# Patient Record
Sex: Female | Born: 1935 | Race: White | Hispanic: No | Smoking: Never smoker
Health system: Southern US, Community
[De-identification: ages and names within clinical notes are randomized; demographics above are authoritative.]

## PROBLEM LIST (undated history)

## (undated) DIAGNOSIS — F32A Depression, unspecified: Secondary | ICD-10-CM

## (undated) DIAGNOSIS — F329 Major depressive disorder, single episode, unspecified: Secondary | ICD-10-CM

## (undated) DIAGNOSIS — F419 Anxiety disorder, unspecified: Secondary | ICD-10-CM

## (undated) DIAGNOSIS — I10 Essential (primary) hypertension: Secondary | ICD-10-CM

## (undated) DIAGNOSIS — I509 Heart failure, unspecified: Secondary | ICD-10-CM

## (undated) DIAGNOSIS — E785 Hyperlipidemia, unspecified: Secondary | ICD-10-CM

## (undated) DIAGNOSIS — R569 Unspecified convulsions: Secondary | ICD-10-CM

## (undated) HISTORY — PX: EYE SURGERY: SHX253

## (undated) HISTORY — DX: Hyperlipidemia, unspecified: E78.5

## (undated) HISTORY — PX: APPENDECTOMY: SHX54

## (undated) HISTORY — DX: Anxiety disorder, unspecified: F41.9

## (undated) HISTORY — DX: Depression, unspecified: F32.A

## (undated) HISTORY — PX: CORONARY ARTERY BYPASS GRAFT: SHX141

## (undated) HISTORY — DX: Heart failure, unspecified: I50.9

## (undated) HISTORY — DX: Essential (primary) hypertension: I10

## (undated) HISTORY — DX: Major depressive disorder, single episode, unspecified: F32.9

## (undated) HISTORY — DX: Unspecified convulsions: R56.9

---

## 2007-06-12 ENCOUNTER — Encounter: Payer: Self-pay | Admitting: Emergency Medicine

## 2007-06-12 ENCOUNTER — Inpatient Hospital Stay (HOSPITAL_COMMUNITY): Admission: EM | Admit: 2007-06-12 | Discharge: 2007-06-16 | Payer: Self-pay | Admitting: Cardiology

## 2007-06-19 ENCOUNTER — Inpatient Hospital Stay (HOSPITAL_COMMUNITY): Admission: EM | Admit: 2007-06-19 | Discharge: 2007-06-29 | Payer: Self-pay | Admitting: Emergency Medicine

## 2007-06-20 ENCOUNTER — Encounter (INDEPENDENT_AMBULATORY_CARE_PROVIDER_SITE_OTHER): Payer: Self-pay | Admitting: Emergency Medicine

## 2007-06-20 ENCOUNTER — Ambulatory Visit: Payer: Self-pay | Admitting: Vascular Surgery

## 2007-07-05 ENCOUNTER — Inpatient Hospital Stay (HOSPITAL_COMMUNITY): Admission: EM | Admit: 2007-07-05 | Discharge: 2007-07-12 | Payer: Self-pay | Admitting: Emergency Medicine

## 2009-05-13 ENCOUNTER — Encounter (INDEPENDENT_AMBULATORY_CARE_PROVIDER_SITE_OTHER): Payer: Self-pay | Admitting: Internal Medicine

## 2009-05-13 ENCOUNTER — Ambulatory Visit: Payer: Self-pay | Admitting: Vascular Surgery

## 2010-03-05 ENCOUNTER — Inpatient Hospital Stay (HOSPITAL_COMMUNITY): Admission: EM | Admit: 2010-03-05 | Discharge: 2009-05-15 | Payer: Self-pay | Admitting: Emergency Medicine

## 2010-05-20 IMAGING — CT CT HEAD W/O CM
1 of 2 series · 14 of 30 positions shown, 18 images · non-contrast
Comparison: None.

CLINICAL DATA: Altered mental status.  Altered level of
consciousness.

CT HEAD WITHOUT CONTRAST
TECHNIQUE: Contiguous axial images were obtained from the base of
the skull through the vertex without contrast.

[Series 2: brain · axial · 0.47mm/px · z∈[+79,+204]mm · 14 of 40 slices shown, 18 images]
[im 3/40  brain]
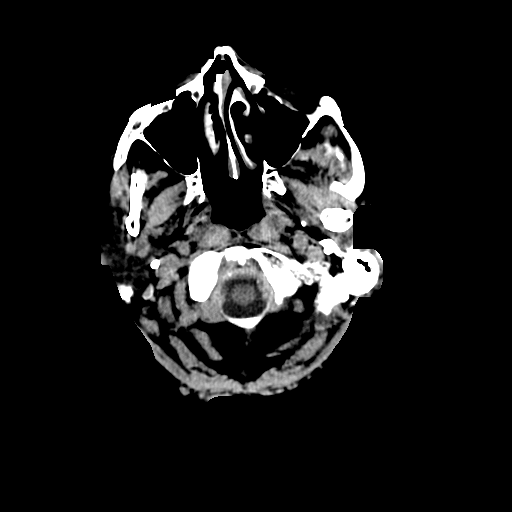
[im 3/40  bone]
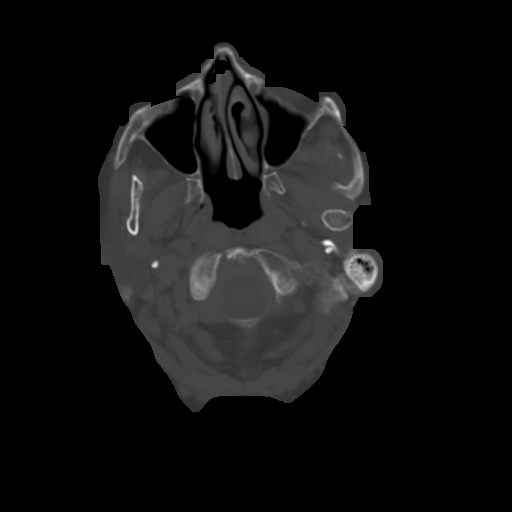
[im 6/40  brain]
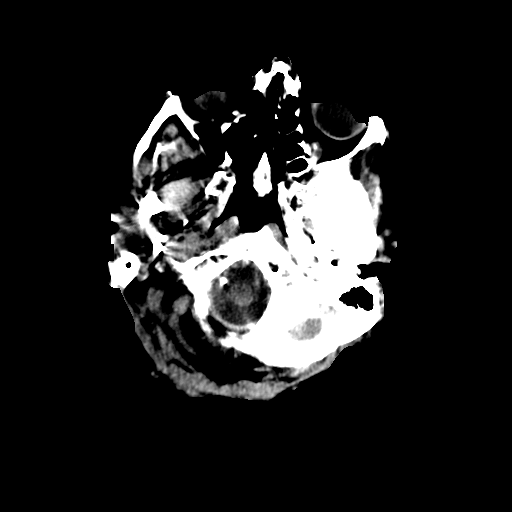
[im 8/40  brain]
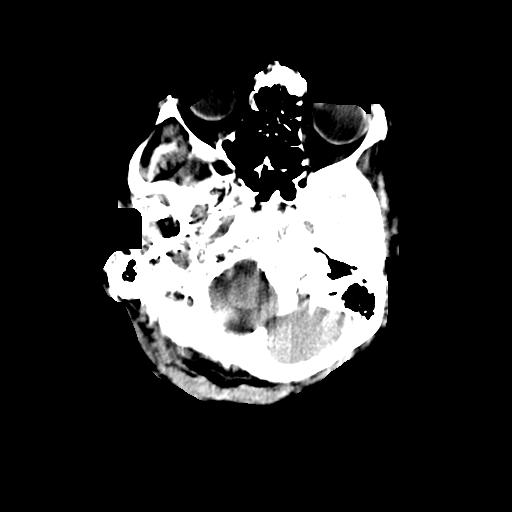
[im 11/40  brain]
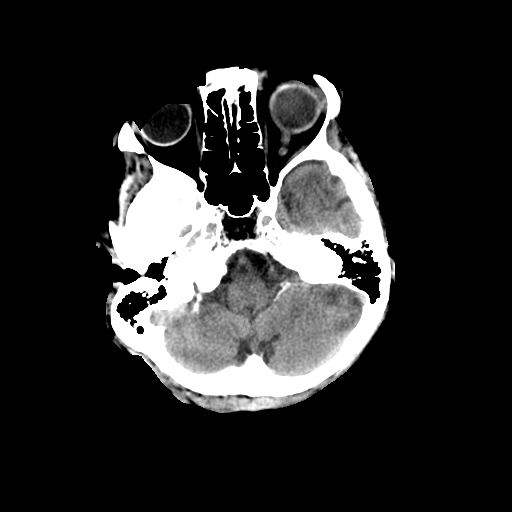
[im 14/40  brain]
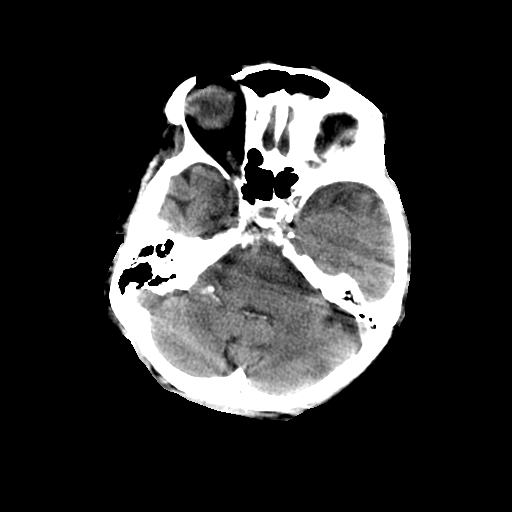
[im 14/40  bone]
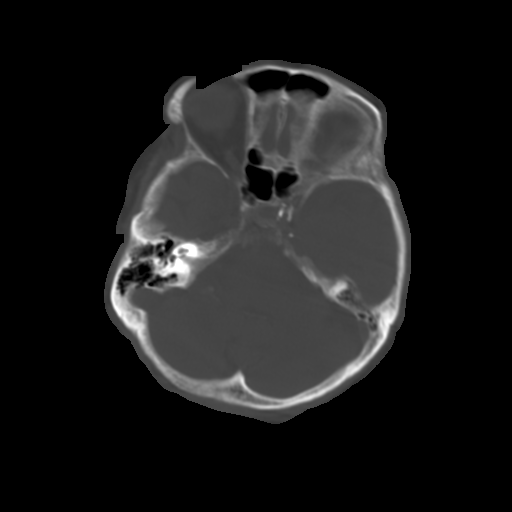
[im 16/40  brain]
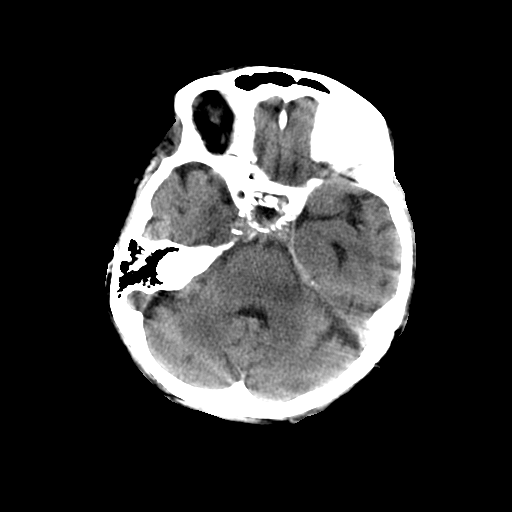
[im 19/40  brain]
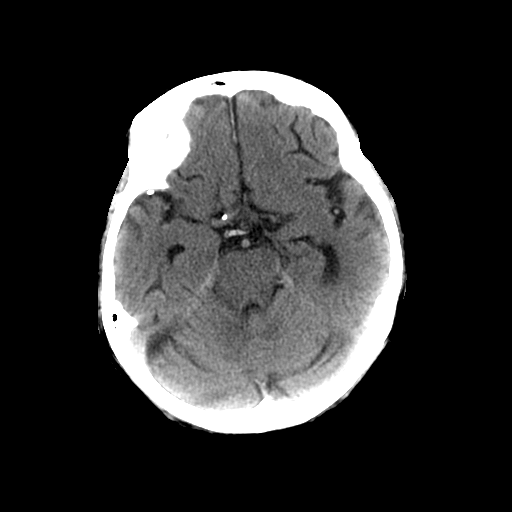
[im 21/40  brain]
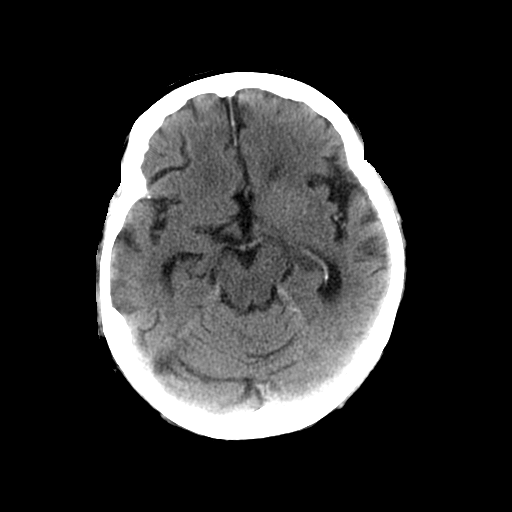
[im 24/40  brain]
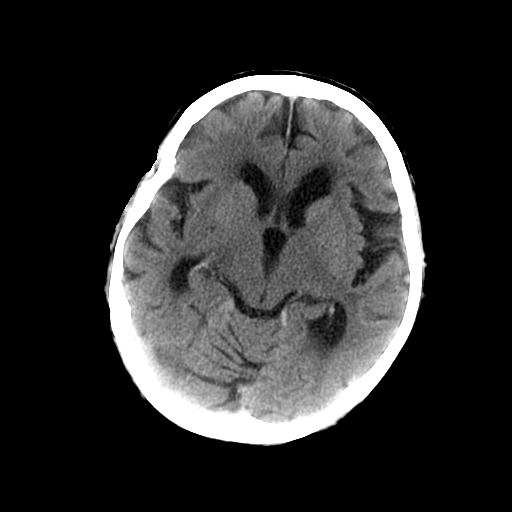
[im 24/40  bone]
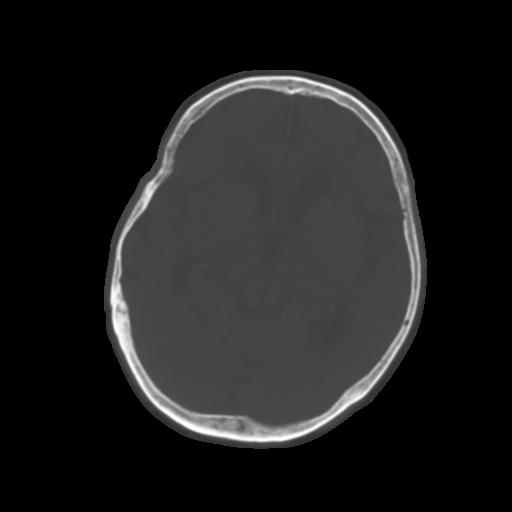
[im 27/40  brain]
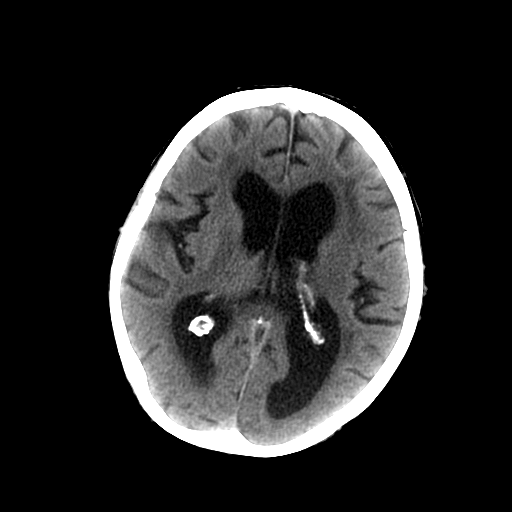
[im 29/40  brain]
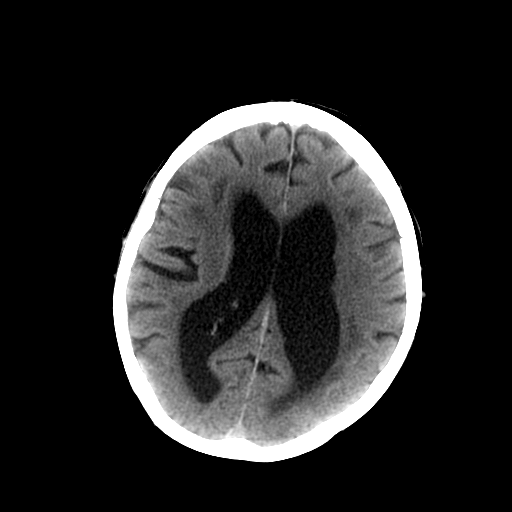
[im 32/40  brain]
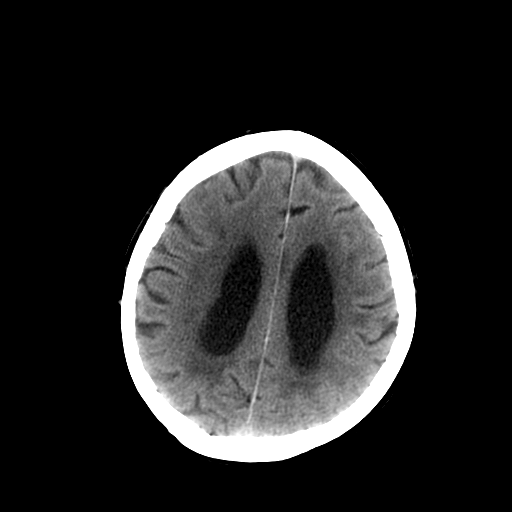
[im 34/40  brain]
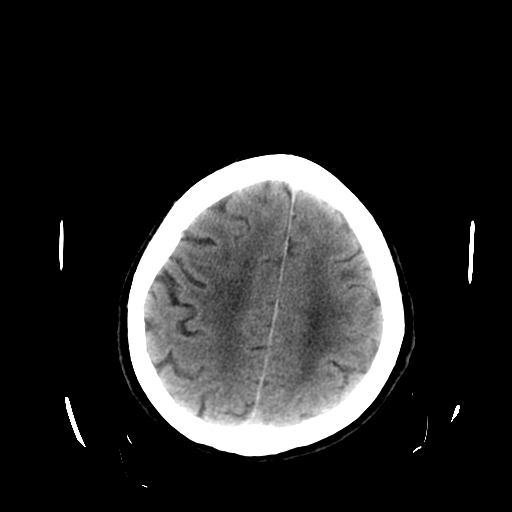
[im 34/40  bone]
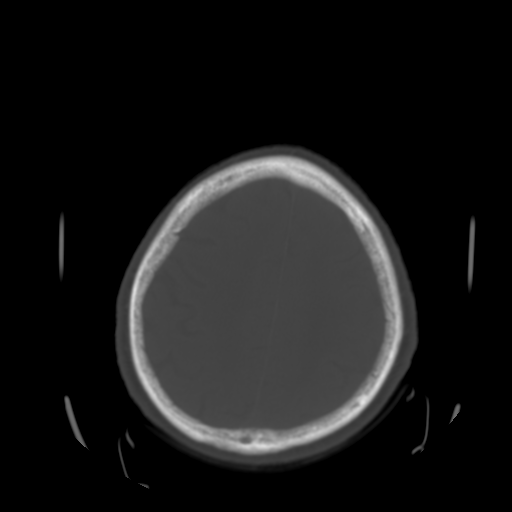
[im 37/40  brain]
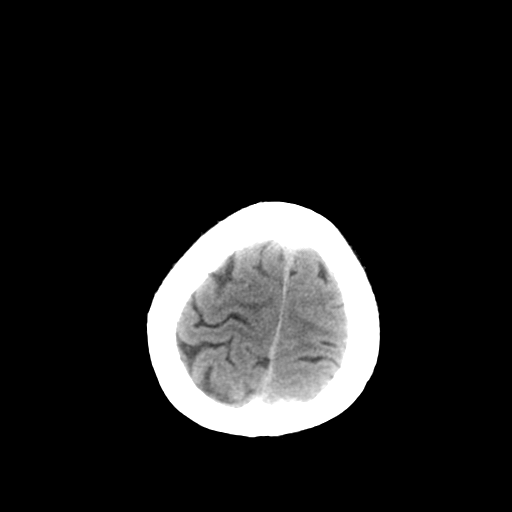

[14 of 30 positions shown; findings below may reference images not displayed]

FINDINGS: Motion artifact was present on the initial examination.
Slices were repeated.  Intracranial atherosclerosis.  High
attenuation of the intravascular compartment suggesting
dehydration.  Chronic ischemic white matter disease and atrophy.
Tiny lacunar infarct in the right basal ganglia.  Paranasal sinuses
appear within normal limits.  Debris in the left external auditory
canal.
IMPRESSION: 1.  No acute intracranial abnormality.
2.  Atrophy, chronic ischemic white matter disease and right basal
ganglia lacunar infarct.

## 2010-06-17 LAB — DIFFERENTIAL
Basophils Absolute: 0 10*3/uL (ref 0.0–0.1)
Lymphocytes Relative: 22 % (ref 12–46)
Lymphocytes Relative: 34 % (ref 12–46)
Lymphs Abs: 2.7 10*3/uL (ref 0.7–4.0)
Monocytes Absolute: 0.3 10*3/uL (ref 0.1–1.0)
Monocytes Absolute: 1.3 10*3/uL — ABNORMAL HIGH (ref 0.1–1.0)
Monocytes Relative: 5 % (ref 3–12)
Neutro Abs: 3.7 10*3/uL (ref 1.7–7.7)
Neutro Abs: 4.5 10*3/uL (ref 1.7–7.7)
Neutrophils Relative %: 73 % (ref 43–77)

## 2010-06-17 LAB — CARDIAC PANEL(CRET KIN+CKTOT+MB+TROPI)
CK, MB: 1.1 ng/mL (ref 0.3–4.0)
CK, MB: 1.3 ng/mL (ref 0.3–4.0)
Relative Index: INVALID (ref 0.0–2.5)
Relative Index: INVALID (ref 0.0–2.5)
Total CK: 32 U/L (ref 7–177)
Total CK: 43 U/L (ref 7–177)
Total CK: 64 U/L (ref 7–177)
Troponin I: 0.02 ng/mL (ref 0.00–0.06)

## 2010-06-17 LAB — BASIC METABOLIC PANEL
CO2: 27 mEq/L (ref 19–32)
Calcium: 8.9 mg/dL (ref 8.4–10.5)
Chloride: 104 mEq/L (ref 96–112)
GFR calc Af Amer: >60 mL/min (ref >60)
GFR calc Af Amer: >60 mL/min (ref >60)
GFR calc non Af Amer: >60 mL/min (ref >60)
Glucose, Bld: 100 mg/dL — ABNORMAL HIGH (ref 70–99)
Potassium: 3.5 mEq/L (ref 3.5–5.1)
Sodium: 139 mEq/L (ref 135–145)
Sodium: 143 mEq/L (ref 135–145)

## 2010-06-17 LAB — URINALYSIS, ROUTINE W REFLEX MICROSCOPIC
Bilirubin Urine: NEGATIVE
Glucose, UA: NEGATIVE mg/dL
Ketones, ur: 15 mg/dL — AB
Nitrite: POSITIVE — AB
Protein, ur: 30 mg/dL — AB
Specific Gravity, Urine: 1.018 (ref 1.005–1.030)
Urobilinogen, UA: 1 mg/dL (ref 0.0–1.0)
pH: 7 (ref 5.0–8.0)

## 2010-06-17 LAB — URINE CULTURE: Colony Count: >=100000

## 2010-06-17 LAB — CBC
HCT: 35.7 % — ABNORMAL LOW (ref 36.0–46.0)
Hemoglobin: 12.5 g/dL (ref 12.0–15.0)
MCV: 83.6 fL (ref 78.0–100.0)
RBC: 4.3 MIL/uL (ref 3.87–5.11)
RDW: 13.1 % (ref 11.5–15.5)
RDW: 13.6 % (ref 11.5–15.5)
WBC: 6.2 10*3/uL (ref 4.0–10.5)
WBC: 7.8 10*3/uL (ref 4.0–10.5)

## 2010-06-17 LAB — PHOSPHORUS: Phosphorus: 3.3 mg/dL (ref 2.3–4.6)

## 2010-06-17 LAB — POCT CARDIAC MARKERS
CKMB, poc: <1 ng/mL — ABNORMAL LOW (ref 1.0–8.0)
Myoglobin, poc: 53.5 ng/mL (ref 12–200)
Troponin i, poc: <0.05 ng/mL (ref 0.00–0.09)

## 2010-06-17 LAB — POCT I-STAT, CHEM 8
BUN: 21 mg/dL (ref 6–23)
Calcium, Ion: 1.07 mmol/L — ABNORMAL LOW (ref 1.12–1.32)
Chloride: 108 meq/L (ref 96–112)
Creatinine, Ser: 0.6 mg/dL (ref 0.4–1.2)
Glucose, Bld: 117 mg/dL — ABNORMAL HIGH (ref 70–99)
HCT: 41 % (ref 36.0–46.0)
Hemoglobin: 13.9 g/dL (ref 12.0–15.0)
Potassium: 4 meq/L (ref 3.5–5.1)
Sodium: 138 meq/L (ref 135–145)
TCO2: 25 mmol/L (ref 0–100)

## 2010-06-17 LAB — LIPID PANEL
HDL: 39 mg/dL — ABNORMAL LOW
Total CHOL/HDL Ratio: 3.1 ratio
Triglycerides: 48 mg/dL
VLDL: 10 mg/dL (ref 0–40)

## 2010-06-17 LAB — HEMOGLOBIN A1C
Hgb A1c MFr Bld: 6 % (ref 4.6–6.1)
Mean Plasma Glucose: 126 mg/dL

## 2010-06-17 LAB — TROPONIN I: Troponin I: 0.02 ng/mL (ref 0.00–0.06)

## 2010-06-17 LAB — TSH
TSH: 1.702 u[IU]/mL (ref 0.350–4.500)
TSH: 1.819 u[IU]/mL (ref 0.350–4.500)

## 2010-06-17 LAB — CK TOTAL AND CKMB (NOT AT ARMC)
CK, MB: 1.2 ng/mL (ref 0.3–4.0)
Relative Index: INVALID (ref 0.0–2.5)
Total CK: 70 U/L (ref 7–177)

## 2010-06-17 LAB — RPR: RPR Ser Ql: NONREACTIVE

## 2010-06-17 LAB — URINE MICROSCOPIC-ADD ON

## 2010-08-11 NOTE — Cardiovascular Report (Signed)
Shaw, Courtney         ACCOUNT NO.:  [id number]   MEDICAL RECORD NO.:  [id number]          PATIENT TYPE:  INP   LOCATION:  6526                         FACILITY:  [facility]   PHYSICIAN:  Mohan N. Sharyn Lull, M.D. DATE OF BIRTH:  1919-12-22   DATE OF PROCEDURE:  06/15/2007  DATE OF DISCHARGE:                            CARDIAC CATHETERIZATION   PROCEDURES:  1. Left cardiac cath with selective left and right coronary      angiography, visualization of saphenous vein graft, LIMA graft via      right groin using Judkins technique.  2. Successful PTCA to PDA using 2.0 x 12 mm long Voyager balloon.  3. Successful PTCA to mid and proximal RCA using 2.0 x 12 mm long      Voyager balloon.  4. Successful PTCA to proximal RCA using 2.5 x 12 mm long Voyager      balloon.  5. Successful deployment of 2.75 x 28 mm long Promus drug-eluting      stent in mid RCA.  6. Successful deployment of 3.0 x 33 mm long Cypher drug-eluting stent      in proximal RCA.  7. Successful post dilatation of both of these stents using 3.0 x 20      mm long [place] Voyager balloon.   INDICATION FOR PROCEDURE:  Courtney Shaw is an 77 year old  white female with past medical history significant for coronary artery  disease, status post CABG and AVR.  She had a bioprosthetic aortic valve  approximately 2-1/2 years ago, history of MI, congestive heart failure,  degenerative joint disease.  She came to the ER via EMS complaining of  vague right-sided chest pain off and on for last 1 week associated with  numbness in the hands.  Denies any nausea, vomiting, diaphoresis.  Denies PND, orthopnea, leg swelling.  Denies palpitation,  lightheadedness, syncope.  The patient was admitted to the telemetry  unit.  MI was ruled out by serial enzymes and EKG.  The patient  subsequently underwent Persantine Myoview, which showed reversible  ischemia in the inferior wall with EF of 63%.  Due to typical anginal  chest pain,  abnormal Persantine Myoview, discussed with the patient and  her daughter regarding left cath, possible PTCA and stenting, its risks  and benefits i.e. death, MI, stroke, need for emergency CABG, risk of  restenosis, local vascular complications, etc., and consented for the  procedure.   PROCEDURE:  After obtaining the informed consent, the patient was  brought to the cath lab and was placed on fluoroscopy table.  Right  groin was prepped and draped in the usual fashion, and 2% Xylocaine was  used for local anesthesia in the right groin.  With the help of a thin-  wall needle, a 6-French arterial sheath was placed.  The sheath was  aspirated and flushed.  Next, a 6-French left Judkins catheter was  advanced over the wire under fluoroscopic guidance up to the ascending  aorta.  Wire was pulled out, the catheter was aspirated and connected to  the manifold.  Catheter was further advanced and engaged into left  coronary ostium.  Multiple views  of the left system were taken.  Next,  the catheter was disengaged and was pulled out over the wire and was  replaced with 6-French right Judkins catheter,  which was advanced over  the wire under fluoroscopic guidance up to the ascending aorta.  The  wire was pulled out.  The catheter was aspirated and connected to the  manifold.  Catheter was further advanced and engaged into right coronary  ostium.  Multiple views of the right system were taken.  Next, the  catheter was disengaged and was engaged into saphenous vein graft to  diagonal one.  Multiple views of this graft were taken.  Next, catheter  was disengaged and was engaged into LIMA to LAD.  This catheter actually  was exchanged over the wire to LIMA diagnostic catheter over the wire,  which was engaged into LIMA to LAD.  Multiple views of this graft were  taken.  Next, catheter was disengaged and was pulled out over the wire.  Sheaths were aspirated and flushed.   FINDINGS:  LV was not done.   Left main had 50% to 60% distal stenosis.  The LAD has 85% to 90% ostial stenosis and then 100% occluded beyond the  mid portion.  Ramus was patent.  Left circumflex was patent.  OM1 to OM4  were small, which were patent.  The RCA has 60% to 90% mid sequential  stenosis and 80% to 85% proximal stenosis.  The PDA has 70% to 75%  proximal stenosis.   INTERVENTIONAL PROCEDURE:  Successful PTCA to mid and proximal RCA was  done using 2.0 x 12 mm long Voyager balloon for predilatation, and then  PTCA to PDA was done using same 2.0 x 12 mm long Voyager balloon.  Lesion in  PDA was dilated from 70% to 75% to less than 10% residual  with excellent TIMI grade 3 distal flow without evidence of dissection  or distal embolization, and then 2.75 x 28 mm long Promus drug-eluting  stent was deployed in the mid RCA at 15 atmospheres pressure and then  3.0 x 33 mm long Cypher drug-eluting stent was deployed in proximal RCA,  overlapping with the proximal edge of Promus stent at 15 atmospheres  pressure, and both these stents were postdilated using 3.0 x 20 mm long  [place] Voyager balloon, going from 18 to 22 atmospheric pressure.  Lesion  was dilated from 60% to 90% to 0% residual with excellent TIMI grade III  distal flow without evidence of dissection or distal embolization.  The  patient received weight-based Angiomax, 300 mg of Plavix during the  procedure.  The patient had edematous rash and itching during the  procedure.  The patient received Solu-Medrol, Benadryl and Pepcid with  resolution of the itching and the rash.  The patient tolerated the  procedure well.  There were no complications.  The patient was  transferred to recovery room in stable condition.      Eduardo Osier. Sharyn Lull, M.D.  Electronically Signed     [facility]/MEDQ  D:  06/15/2007  T:  06/15/2007  Job:  [id number]

## 2010-08-11 NOTE — Discharge Summary (Signed)
Courtney Shaw, Courtney Shaw         ACCOUNT NO.:  [id number]   MEDICAL RECORD NO.:  [id number]          PATIENT TYPE:  INP   LOCATION:  2028                         FACILITY:  [facility]   PHYSICIAN:  Courtney Shaw. Courtney Shaw, M.D. DATE OF BIRTH:  [birth year]   DATE OF ADMISSION:  06/19/2007  DATE OF DISCHARGE:  06/29/2007                               DISCHARGE SUMMARY   ADMITTING DIAGNOSIS:  1. Bronchitis, rule out pneumonia, rule out PE.  2. Coronary artery disease.  3. History of anteroseptal wall myocardial infarction in the past.  4. Status post recent PCI to RCA.  5. Ischemic cardiomyopathy.  6. Compensated congestive heart failure.  7. History of coronary artery bypass graft and AVR. She had      bioprosthetic valve approximately 1-1/2-year ago.  8. Hypertension.  9. Non-insulin-dependent diabetes mellitus controlled by diet.  10.Hypercholesteremia.  11.Degenerative joint disease.   FINAL DIAGNOSIS:  1. Status post bronchitis.  2. Status post antibiotic associated enterocolitis.  3. Coronary artery disease status post coronary artery bypass graft      AVR.  4. History of anteroseptal wall myocardial infarction.  5. Ischemic cardiomyopathy status post recent PCI to RCA with two drug-      eluting stents in the proximal and mid RCA.  6. Compensated congestive heart failure.  7. Hypertension.  8. Non-insulin-dependent diabetes mellitus controlled by diet.  9. Hypercholesteremia.  10.Degenerative joint disease.   DISCHARGE MEDICATIONS:  1. Enteric-coated aspirin 81 mg 1 tablet daily.  2. Plavix 75 mg 1 tablet daily with food.  3. Coreg CR 200 mg 1 capsule daily.  4. Enalapril 2.5 mg 1 tablet daily.  5. Lipitor 20 mg 1 tablet daily.  6. Tylenol #3 with codeine 1 tablet every 8 hours as needed for knee      joint pain.  7. Nitrostat 0.4 mg sublingual use as directed.  8. Protonix 40 mg 1 tablet daily.   DIET:  Low salt, low cholesterol 1800 calories ADA diet.   ACTIVITY:  Walk  with assistance.   FOLLOW-UP:  With me in 1 week.   CONDITION AT DISCHARGE:  Stable.  CBC in 1 week.   BRIEF HISTORY AND HOSPITAL COURSE:  Courtney Shaw is an 77 year old  white female with a past medical history significant for coronary artery  disease, history of anteroseptal wall MI status post CABG. She had LIMA  to LAD and saphenous vein graft to diagonal one and AVR. She had  bioprosthetic aortic valve, ischemic cardiomyopathy status post PTCA  stenting to RCA approximately 1 week ago, history of congestive heart  failure, hypertension, hypercholesteremia, degenerative joint disease.  She came to the ER complaining of not feeling well associated with  nausea and palpitation, also complains of cough with low grade fever.  The patient denies any chest pain or diaphoresis.  Denies PND,  orthopnea, leg swelling.  She states since hospital discharge, she was  sitting in the chair or in the bed.  Denies any abdominal pain, vomiting  or diarrhea but complains of leg pain and knee joint pain.  The patient  was noted to have minimally elevated D-dimer. Subsequently had  spiral CT  of the chest which was negative for pulmonary embolism in the ER.   PAST MEDICAL HISTORY:  As above.   PAST SURGICAL HISTORY:  She had CABG and AVR approximately 1-1/2-year  ago in [place]. She had an appendectomy many years ago.   ALLERGIES:  She is allergic to IV CONTRAST and gets hives.   MEDICATION AT HOME:  1. Enteric-coated aspirin 325 mg p.o. daily.  2. Plavix 75 mg p.o. daily.  3. Coreg CR 10 mg p.o. daily.  4. Enalapril 5 mg p.o. daily.  5. Lipitor 20 mg p.o. daily.  6. Tylenol with Codeine as needed for knee joint pain.  7. Nitrostat sublingual p.r.n.   SOCIAL HISTORY:  She is widowed, retired, moved from [place], lives  with daughter, smoked 3 cigarettes per day for 4 or 5 years, quit 35  years ago.  No history of alcohol abuse.  She worked as a Runner, broadcasting/film/video in the  past.   FAMILY HISTORY:   Positive for coronary artery disease.   PHYSICAL EXAMINATION:  She was alert, awake, oriented x3 in no acute  distress.  Blood pressure was 144/67, pulse was 110, sinus tach on the  monitor. Temperature was 100.1 degrees Fahrenheit.  Conjunctivae was pink.  NECK:  Supple, no JVD, no bruit.  LUNGS:  She had decreased breath sounds with occasional rhonchi.  CARDIOVASCULAR:  S1, S2 was normal.  She was tachycardiac.  There was a  soft systolic murmur.  ABDOMEN:  Soft.  Bowel sounds were present, nontender.  EXTREMITIES:  There was no clubbing or cyanosis.  There was trace edema.   LABORATORY DATA:  EKG showed sinus tach with left atrial enlargement,  old anteroseptal wall MI, ST-T wave changes in the lateral leads. There  were no new acute ischemic changes. Her hemoglobin was 13, hematocrit  38.6, white count of 9.8 with left shift.  Her point of care CPK-MB and  troponin I were negative.  Potassium was 3.9, BUN 17, creatinine was  0.9, glucose was 137.  CT of the chest as stated above was negative for  acute PE or thoracic aortic dissection.  There were changes of CABG and  aortic valve replacement, her C diff toxin  during the hospital stay was  positive. Her hemoglobin today is 10.9, hematocrit 31.9, white count of  5.6 which has been stable   BRIEF HOSPITAL COURSE:  The patient was admitted to the telemetry unit  and was started on broad-spectrum antibiotics.  The patient subsequently  developed diarrhea. C diff toxins were obtained which were positive.  The patient was started on IV Flagyl. A repeat C.  Diff toxin has been  negative.  The patient remained afebrile after initial low grade fever  during the hospital stay.  Her CT of the chest was negative for PE.  Bilateral duplex ultrasound of the lower extremities were also obtained  which showed no evidence of DVT. The patient initially was supposed to  be discharged home but then family decided that the patient will require   skilled nursing facility.  The patient has multiple bed offers and  family has accepted bed at [facility] skilled nursing facility.  The  patient will be discharged home on above medications. The patient had  heme-positive stools, but has been refusing for GI evaluation although  her hemoglobin and hematocrit has been stable.  Will discuss further  regarding GI evaluation as outpatient.  The patient will be discharged  home on above  medications and will be followed up in my office in 1  week.      Courtney Shaw. Courtney Shaw, M.D.  Electronically Signed     [facility]/MEDQ  D:  06/29/2007  T:  06/29/2007  Job:  [id number]

## 2010-08-11 NOTE — Discharge Summary (Signed)
Courtney Shaw, Courtney Shaw         ACCOUNT NO.:  [id number]   MEDICAL RECORD NO.:  [id number]          PATIENT TYPE:  INP   LOCATION:  6526                         FACILITY:  [facility]   PHYSICIAN:  Mohan N. Sharyn Lull, M.D. DATE OF BIRTH:  1919-06-23   DATE OF ADMISSION:  06/12/2007  DATE OF DISCHARGE:  06/16/2007                               DISCHARGE SUMMARY   ADMITTING DIAGNOSES:  1. Chest pain, rule out myocardial infarction.  2. Coronary artery disease status post coronary artery bypass      grafting/aortic valve replacement approximately two-and-a-half      years ago.  3. History of ventral septal myocardial infarction.  4. Hypertension.  5. History of congestive heart failure.  6. Ischemic cardiomyopathy.  7. Degenerative joint disease.   DISCHARGE DIAGNOSES:  1. Stable angina, positive Persantine-Myoview, status post      percutaneous transluminal coronary angioplasty (PTCA) and stenting      to right coronary artery.  2. Coronary artery disease status post coronary artery bypass      grafting/aortic valve replacement.  She had a bioprosthetic aortic      valve.  3. History of ventral septal wall myocardial infarction.  4. Ischemic cardiomyopathy.  5. Compensated congestive heart failure.  6. Hypertension.  7. Hypercholesterolemia.  8. Degenerative joint disease.   DISCHARGE HOME MEDICATIONS:  1. Enteric-coated aspirin 325 mg one tablet daily for one month and      then 81 mg one tablet daily.  2. Plavix 75 mg one tablet daily with food.  3. Coreg-CR 10 mg one capsule daily.  4. Enalapril 5 mg one tablet daily.  5. Lipitor 20 mg one tablet daily.  6. Tylenol with Codeine one to two tablets every eight hours as needed      as before.  7. Nitro-Stat 0.4 mg sublingual, use as directed.  8. Cipro 500 mg one tablet twice daily for three more days.   INSTRUCTIONS:  Post PTCA and stent instructions have been given.   ACTIVITY:  Walk with assistance only.   DIET:  Low  salt, low cholesterol, and avoid sweets.   CONDITION AT DISCHARGE:  Stable.  The patient will be discharged home  with her daughter.   BRIEF HISTORY AND HOSPITAL COURSE:  Ms. Mitter is an 77 year old,  white female with past medical history significant for coronary artery  disease status post CABG and AVR __________ approximately two-and-a-half  years ago, history of MI, congestive heart failure, and degenerative  joint disease.  She came to the ER via EMS complaining of vague right-  sided chest pain off and on for the last one week associated with  numbness in the hands.  Denies any nausea, vomiting, diaphoresis.  Denies tingling, numbness, or leg swelling.  Denies palpitations,  lightheadedness, or syncope.  Denies chest pain at present.  Also  complains of hesitancy of urination.  No fever or chills or dysuria.   PAST MEDICAL HISTORY:  As above.   PAST SURGICAL HISTORY:  1. CABG/AVR approximately two-and-a-half years ago in [place].  2. Status post appendectomy.   ALLERGIES:  No known drug allergies.   MEDICATIONS  AT HOME:  1. Enalapril 5 mg p.o. daily.  2. Tylenol with Codeine.  3. Ambien.   SOCIAL HISTORY:  She is widowed, retired, moved from [place], and lives  with daughter.  She smoked three cigarettes for four or five years with  35+ years overall.  No history of alcohol abuse.  She worked as a  Runner, broadcasting/film/video in the past.   FAMILY HISTORY:  Her father died of an MI at the age of 21.  Mother died  of old age.   PHYSICAL EXAMINATION:  GENERAL:  She is alert and oriented x3 in no  apparent distress.  VITAL SIGNS:  Blood pressure of 157/61, pulse was 91.  HEENT:  Conjunctivae was pink.  NECK:  Supple, no JVD.  LUNGS:  Clear to auscultation without rhonchi or rales.  CARDIOVASCULAR:  S1 S2 was normal, there was a 2/6 systolic murmur.  ABDOMEN:  Soft, bowel sounds were present, nontender.  EXTREMITIES:  No clubbing or cyanosis; there was trace edema.   LABORATORY  DATA:  Hemoglobin was 13.3, hematocrit 39, white count of  5.3, K is 3.7, glucose 131, BUN 18, creatinine 0.81, CPK-MB was less  than 1, troponin was less than 0.05.  EKG showed a normal sinus rhythm  with Q-waves in the anteroseptal leads 2 and 3, which were old, no new  acute ischemic changes are noted.   BRIEF HOSPITAL COURSE:  The patient was admitted to telemetry unit.  MI  was ruled out by serial enzymes and EKGs.  The patient subsequently  underwent a Persantine-Myoview on March 17th, which showed an inferior  wall reversible ischemia with an EF of 63%.  I discussed with the  patient and her daughter at length regarding Persantine-Myoview results  and a left cath, possible PTCA, and stenting.  The risks and benefits,  i.e., death, MI, stroke, need for emergency CABG, risk of restenosis,  local vascular complications, etc., and consented for the procedure.  The patient subsequently underwent a left cardiac cath with selective  left and right coronary angiography, __________  and PTCA/stenting to  RCA and PDA.  As per procedure report, the patient tolerated the  procedure well and there were no complications.  The patient did not  have any chest pain during the hospital stay.  Her __________ was stable  with no evidence of __________ .  Outpatient Cardiac Rehab was called.  The patient walked with the help of a walker.  The patient will be  referred for cardiac rehab, but the patient is concerned about the  transportation.  The patient will be discharged home on her home  medications and will be followed up in the office in one week.  The  patient was discharged with her daughter.  Also of importance of taking  all her medications including aspirin and Plavix.      Eduardo Osier. Sharyn Lull, M.D.  Electronically Signed     [facility]/MEDQ  D:  06/16/2007  T:  06/16/2007  Job:  [id number]

## 2010-08-11 NOTE — Discharge Summary (Signed)
Courtney Shaw, Courtney Shaw         ACCOUNT NO.:  [id number]   MEDICAL RECORD NO.:  [id number]          PATIENT TYPE:  INP   LOCATION:  4736                         FACILITY:  [facility]   PHYSICIAN:  Mohan N. Sharyn Lull, M.D. DATE OF BIRTH:  1919/11/23   DATE OF ADMISSION:  07/05/2007  DATE OF DISCHARGE:  07/12/2007                               DISCHARGE SUMMARY   ADMITTING DIAGNOSES:  1. Hypotension, secondary to sepsis, dehydration, medications.  2. Diarrhea, rule out antibiotic-associated Enterocolitis.  3. Compensated congestive heart failure.  4. Ischemic cardiomyopathy.  5. Coronary artery disease.  6. History of anteroseptal wall myocardial infarction.  7. Status post coronary artery bypass grafting and aortic valve      replacement in past.  8. Status post percutaneous coronary intervention to right coronary      artery recently.  9. Hypertension.  10.Non-insulin-dependent diabetes mellitus, controlled by diet.  11.Hypercholesteremia.  12.Degenerative joint disease.  13.Gastroesophageal reflux disease.  14.Status post recent C. difficile colitis.   FINAL DIAGNOSES:  1. Status post hypotensive shock.  2. Status post diarrhea, probably related to antibiotic-associated      colitis.  3. Compensated congestive heart failure.  4. Ischemic cardiomyopathy.  5. Coronary artery disease.  6. Status post anteroseptal wall myocardial infarction in the past.  7. Status post coronary artery bypass grafting and aortic valve      replacement.  8. Status post recent percutaneous coronary intervention to right      coronary artery.  9. Hypertension.  10.Non-insulin-dependent diabetes mellitus controlled by diet.  11.Hypercholesteremia.  12.Degenerative joint disease.  13.Gastroesophageal reflux disease.  14.Status post recent C. difficile colitis.   DISCHARGE MEDICATIONS:  1. Enteric-coated aspirin 81 mg one tablet daily.  2. Plavix 75 mg one tablet daily with food.  3. Coreg CR 100  mg one capsule daily.  4. Enalapril 2.5 mg one tablet daily.  5. Risperdal 0.5 mg one tablet daily.  6. Tylenol #3 one tablet every 6-8 hours for knee join pain as before.  7. Flagyl 500 mg one tablet three times daily for 5 days.  8. Lexapro 5 mg one tablet daily.  9. Prilosec 20 mg one daily.  10.Lipitor 20 mg one tablet daily.   DIET:  Low salt, low cholesterol, 1800 calorie ADA diet.   ACTIVITY:  Increase activity slowly as tolerated with assistance.   FOLLOW-UP:  To follow up with me in one week.   CONDITION AT DISCHARGE:  Stable.   The patient will be discharged to [facility].  This was  discussed with daughter and the patient agreed for transfer.   BRIEF HISTORY AND HOSPITAL COURSE:  [name] is an 77 year old  white female with past medical history significant for coronary artery  disease, history of anteroseptal wall MI, status post CABG and AVR in  [place] approximately 1-1/2 years ago, ischemic cardiomyopathy, status  post recent PTCA stenting to RCA, history of congestive heart failure,  hypertension, hypercholesteremia, degenerative joint disease, non-  insulin-dependent diabetes mellitus controlled by diet, status post  recent antibiotic-associated Enterocolitis.  Resident of [facility].  She came to the ER  because of vague abdominal pain  associated with diarrhea, and was noted to have elevated white count of  17,000 with a BP of 82/50.  The patient received fluid challenge in the  ER, with increase of blood pressure to 103/60.  The patient recently was  discharged from the hospital and was treated for C. difficile colitis.  The patient denies chest pain, shortness of breath, palpitations; denies  fever or chills but feels warm.   PAST MEDICAL HISTORY:  As above.   PAST SURGICAL HISTORY:  1. CABG and AVR in [place] in the past.  2. Appendectomy in the past.   SOCIAL HISTORY:  She has retired and moved recently from [place].   A  resident of [facility].  She smoked a few cigarettes per  day for 4-5 years, quit 35+ years ago.  No history of alcohol abuse.  She is a retired Runner, broadcasting/film/video.   FAMILY HISTORY:  Positive for coronary artery disease.   ALLERGIES:  NO KNOWN DRUG ALLERGIES.   MEDICATIONS AT HOME:  1. Enteric-coated aspirin 81 mg p.o. daily.  2. Plavix 75 mg p.o. daily.  3. Coreg CR 20 mg p.o. daily.  4. Enalapril 2.5 mg p.o. daily.  5. Lipitor 20 mg p.o. daily.  6. Tylenol #3 p.r.n.  7. Prilosec 20 mg p.o. daily.  8. Lexapro 5 mg p.o. daily.   PHYSICAL EXAMINATION:  She is alert and oriented x3, in no acute  distress.  Blood pressure 103/60, pulse 89 and regular.  Conjunctivae was pink.  Neck was supple, no JVD, no bruit.  Oral mucosa  dry.  LUNGS:  Clear to auscultation without rhonchi or rales.  CARDIOVASCULAR:  S1 and S2 were normal.  There was a 2/6 systolic  murmur.  ABDOMEN:  Soft, nontender.  Bowel sounds were present.  She had mild  left lower quadrant tenderness.  EXTREMITIES:  There was no clubbing, cyanosis or edema.   LABS:  Hemoglobin 12.3, hematocrit 36.6, white count 17.3.  Potassium  3.6, BUN 19, creatinine 0.78, glucose 131.  Her stools for C. difficile  toxin were negative this time x2.   BRIEF HOSPITAL COURSE:  The patient was admitted to the telemetry unit.  The patient received fluid challenge, with improvement in her blood  pressure.  Her BP medicines were reduced.  Stool cultures for  Salmonella, Schlegelella, campylobacter were negative.  Stool for ova  and parasites were negative.  Stool for WBC was negative.  Stool for C.  difficile toxin was also negative x2; although the patient was started  in the ER with IV Flagyl.  Prior to this, the patient did have 2  positive cultures for  C. difficile toxin.  The patient remained  afebrile during the hospital stay.  Her white count gradually normalized  towards normal.  An OT, PT consult was called.  The patient  has been  ambulating in the hallway without any problems.  Her diarrhea resolved  on p.o. Flagyl.  She is tolerating p.o. Flagyl now well.  The patient  will be discharged home on above medications, and will be followed up in  my office in one week.      Eduardo Osier. Sharyn Lull, M.D.  Electronically Signed     [facility]  D:  07/12/2007  T:  07/12/2007  Job:  [id number]

## 2010-12-21 LAB — CBC
HCT: 32.3 — ABNORMAL LOW
HCT: 35.5 — ABNORMAL LOW
HCT: 37.7
HCT: 38.6
HCT: 39
Hemoglobin: 11.1 — ABNORMAL LOW
Hemoglobin: 12.1
Hemoglobin: 13
Hemoglobin: 13.3
MCHC: 33.9
MCHC: 34
MCHC: 34
MCHC: 34.2
MCHC: 34.5
MCV: 81.1
MCV: 81.5
MCV: 81.8
MCV: 82
Platelets: 279
Platelets: 280
Platelets: 287
Platelets: 290
Platelets: 323
RBC: 4.23
RBC: 4.81
RDW: 13.6
RDW: 13.9
RDW: 13.9
RDW: 13.9
RDW: 14.1
RDW: 14.2
WBC: 5.2
WBC: 7.1
WBC: 7.5
WBC: 9.8

## 2010-12-21 LAB — CLOSTRIDIUM DIFFICILE EIA: C difficile Toxins A+B, EIA: NEGATIVE

## 2010-12-21 LAB — BASIC METABOLIC PANEL
BUN: 12
BUN: 13
BUN: 15
BUN: 20
BUN: 21
CO2: 24
CO2: 25
CO2: 25
Calcium: 8.3 — ABNORMAL LOW
Calcium: 8.3 — ABNORMAL LOW
Calcium: 8.8
Calcium: 9
Calcium: 9
Calcium: 9.2
Chloride: 109
Chloride: 110
Creatinine, Ser: 0.66
Creatinine, Ser: 0.67
Creatinine, Ser: 1.05
GFR calc Af Amer: >60
GFR calc Af Amer: >60
GFR calc non Af Amer: 50 — ABNORMAL LOW
GFR calc non Af Amer: >60
GFR calc non Af Amer: >60
GFR calc non Af Amer: >60
GFR calc non Af Amer: >60
Glucose, Bld: 100 — ABNORMAL HIGH
Glucose, Bld: 159 — ABNORMAL HIGH
Glucose, Bld: 95
Glucose, Bld: 98
Potassium: 3.5
Potassium: 3.6
Potassium: 3.7
Potassium: 3.7
Sodium: 139
Sodium: 140
Sodium: 141

## 2010-12-21 LAB — POCT I-STAT, CHEM 8
BUN: 17
Calcium, Ion: 1.04 — ABNORMAL LOW
Creatinine, Ser: 0.9
TCO2: 25

## 2010-12-21 LAB — COMPREHENSIVE METABOLIC PANEL
ALT: 15
AST: 15
Alkaline Phosphatase: 58
BUN: 19
Calcium: 8.3 — ABNORMAL LOW
Chloride: 111
GFR calc Af Amer: >60
GFR calc non Af Amer: >60
Glucose, Bld: 121 — ABNORMAL HIGH
Potassium: 3.6
Sodium: 136
Total Bilirubin: 0.7
Total Protein: 6.2

## 2010-12-21 LAB — PROTIME-INR
INR: 1
INR: 1
Prothrombin Time: 13.3

## 2010-12-21 LAB — HEPARIN LEVEL (UNFRACTIONATED): Heparin Unfractionated: 0.4

## 2010-12-21 LAB — CK TOTAL AND CKMB (NOT AT ARMC)
CK, MB: 0.8
CK, MB: 0.9
Relative Index: INVALID
Relative Index: INVALID
Relative Index: INVALID
Total CK: 32
Total CK: 46

## 2010-12-21 LAB — URINALYSIS, ROUTINE W REFLEX MICROSCOPIC
Glucose, UA: NEGATIVE
Ketones, ur: NEGATIVE
Leukocytes, UA: NEGATIVE
Protein, ur: NEGATIVE
Urobilinogen, UA: 0.2

## 2010-12-21 LAB — DIFFERENTIAL
Eosinophils Relative: 1
Lymphocytes Relative: 8 — ABNORMAL LOW
Lymphs Abs: 0.7
Monocytes Absolute: 0.9

## 2010-12-21 LAB — APTT
aPTT: 32
aPTT: 34
aPTT: 35

## 2010-12-21 LAB — POCT CARDIAC MARKERS
CKMB, poc: <1 — ABNORMAL LOW
Myoglobin, poc: 40.4

## 2010-12-21 LAB — TROPONIN I: Troponin I: 0.08 — ABNORMAL HIGH

## 2010-12-21 LAB — CARDIAC PANEL(CRET KIN+CKTOT+MB+TROPI)
CK, MB: 0.7
CK, MB: 1.6
CK, MB: 2.5
Relative Index: INVALID
Total CK: 34
Troponin I: 0.04
Troponin I: 0.08 — ABNORMAL HIGH

## 2010-12-21 LAB — HEMOGLOBIN A1C
Hgb A1c MFr Bld: 5.4
Mean Plasma Glucose: 115

## 2010-12-21 LAB — LIPID PANEL
LDL Cholesterol: 108 — ABNORMAL HIGH
Total CHOL/HDL Ratio: 4.2
VLDL: 14

## 2010-12-21 LAB — URINE MICROSCOPIC-ADD ON

## 2010-12-21 LAB — D-DIMER, QUANTITATIVE: D-Dimer, Quant: 0.5 — ABNORMAL HIGH

## 2010-12-22 LAB — URINALYSIS, ROUTINE W REFLEX MICROSCOPIC
Glucose, UA: NEGATIVE
Hgb urine dipstick: NEGATIVE
pH: 6

## 2010-12-22 LAB — URINE MICROSCOPIC-ADD ON

## 2010-12-22 LAB — BASIC METABOLIC PANEL
BUN: 12
BUN: 14
BUN: 15
Calcium: 8.1 — ABNORMAL LOW
Calcium: 8.7
Chloride: 104
GFR calc Af Amer: >60
GFR calc Af Amer: >60
GFR calc Af Amer: >60
GFR calc non Af Amer: >60
GFR calc non Af Amer: >60
GFR calc non Af Amer: >60
GFR calc non Af Amer: >60
Glucose, Bld: 96
Glucose, Bld: 96
Potassium: 2.7 — CL
Potassium: 3.5
Potassium: 3.6
Potassium: 4.2
Sodium: 137
Sodium: 139
Sodium: 140
Sodium: 140

## 2010-12-22 LAB — CBC
HCT: 31.9 — ABNORMAL LOW
HCT: 32.6 — ABNORMAL LOW
HCT: 32.9 — ABNORMAL LOW
Hemoglobin: 10.9 — ABNORMAL LOW
Hemoglobin: 11.2 — ABNORMAL LOW
MCHC: 33.7
MCHC: 33.8
MCHC: 34.2
MCV: 81.2
MCV: 81.7
MCV: 82.1
Platelets: 345
Platelets: 356
Platelets: 369
RBC: 3.92
RBC: 4.02
RBC: 4.03
RBC: 4.48
RDW: 13.9
RDW: 14
RDW: 14.2
RDW: 14.3
WBC: 13.6 — ABNORMAL HIGH
WBC: 6.5

## 2010-12-22 LAB — STOOL CULTURE

## 2010-12-22 LAB — CLOSTRIDIUM DIFFICILE EIA
C difficile Toxins A+B, EIA: NEGATIVE
C difficile Toxins A+B, EIA: NEGATIVE
C difficile Toxins A+B, EIA: NEGATIVE

## 2010-12-22 LAB — DIFFERENTIAL
Eosinophils Relative: 0
Lymphocytes Relative: 7 — ABNORMAL LOW
Lymphs Abs: 1.2
Monocytes Relative: 11
Neutrophils Relative %: 82 — ABNORMAL HIGH

## 2010-12-22 LAB — HEMOGLOBIN A1C: Hgb A1c MFr Bld: 5.3

## 2010-12-22 LAB — COMPREHENSIVE METABOLIC PANEL
ALT: 11
AST: 12
CO2: 24
Calcium: 8.6
Creatinine, Ser: 0.78
GFR calc Af Amer: >60
GFR calc non Af Amer: >60
Sodium: 139
Total Protein: 5.5 — ABNORMAL LOW

## 2010-12-22 LAB — GIARDIA/CRYPTOSPORIDIUM SCREEN(EIA): Cryptosporidium Screen (EIA): NEGATIVE

## 2010-12-22 LAB — MAGNESIUM: Magnesium: 2.4

## 2010-12-22 LAB — APTT: aPTT: 31

## 2010-12-22 LAB — FECAL LACTOFERRIN, QUANT: Fecal Lactoferrin: POSITIVE

## 2014-11-19 ENCOUNTER — Non-Acute Institutional Stay (SKILLED_NURSING_FACILITY): Payer: Medicare Other | Admitting: Internal Medicine

## 2014-11-19 DIAGNOSIS — R569 Unspecified convulsions: Secondary | ICD-10-CM

## 2014-11-19 DIAGNOSIS — E785 Hyperlipidemia, unspecified: Secondary | ICD-10-CM

## 2014-11-19 DIAGNOSIS — B9620 Unspecified Escherichia coli [E. coli] as the cause of diseases classified elsewhere: Secondary | ICD-10-CM | POA: Diagnosis not present

## 2014-11-19 DIAGNOSIS — I110 Hypertensive heart disease with heart failure: Secondary | ICD-10-CM | POA: Diagnosis not present

## 2014-11-19 DIAGNOSIS — A4151 Sepsis due to Escherichia coli [E. coli]: Secondary | ICD-10-CM | POA: Diagnosis not present

## 2014-11-19 DIAGNOSIS — N390 Urinary tract infection, site not specified: Secondary | ICD-10-CM | POA: Diagnosis not present

## 2014-11-19 DIAGNOSIS — I509 Heart failure, unspecified: Secondary | ICD-10-CM | POA: Diagnosis not present

## 2014-11-19 DIAGNOSIS — R6521 Severe sepsis with septic shock: Secondary | ICD-10-CM | POA: Diagnosis not present

## 2014-11-19 DIAGNOSIS — F329 Major depressive disorder, single episode, unspecified: Secondary | ICD-10-CM

## 2014-11-19 DIAGNOSIS — F32A Depression, unspecified: Secondary | ICD-10-CM

## 2014-11-19 DIAGNOSIS — D72829 Elevated white blood cell count, unspecified: Secondary | ICD-10-CM

## 2014-11-19 NOTE — Progress Notes (Signed)
MRN: [id number] Name: Courtney Shaw  Sex: female Age: 81 y.o. DOB: 1919-12-29  [place] #: [facility] Facility/Room:107 Level Of Care: SNF Provider: Inocencio Homes D Emergency Contacts: Extended Emergency Contact Information Primary Emergency Contact: [facility] Address: [address]  [place] Home Phone: [phone] Relation: None  Code Status:   Allergies: Review of patient's allergies indicates no known allergies.  Chief Complaint  Patient presents with  . New Admit To SNF    HPI: Patient is 81 y.o. female with hx HTN, CHF, CAD, HLD, depression, anxiety who was admitted to [facility] from 8/13-21 for E. Coli sepsis 2/2 UTI. Initially required ICU and pressors. Complicated by a seizure, not sure if from benzo withdrawal but neuro felt required long term tx. with Keppra. Pt is admitted to SNF for generalized weakness. While at SNF pt will be followed for depression, tx with lexapro, HLD, tx with zocor and CHF tx with coreg.  Past Medical History  Diagnosis Date  . Depression   . Seizures   . Hyperlipidemia   . Hypertension   . CHF (congestive heart failure)   . Anxiety     Past Surgical History  Procedure Laterality Date  . Coronary artery bypass graft    . Appendectomy    . Eye surgery      cataract      Medication List       This list is accurate as of: 11/19/14 11:59 PM.  Always use your most recent med list.               carvedilol 10 MG 24 hr capsule  Commonly known as:  COREG CR  Take 10 mg by mouth daily.     celecoxib 100 MG capsule  Commonly known as:  CELEBREX  Take 100 mg by mouth daily.     cephALEXin 250 MG capsule  Commonly known as:  KEFLEX  Take 250 mg by mouth 2 (two) times daily. For 4 days     clopidogrel 75 MG tablet  Commonly known as:  PLAVIX  Take 75 mg by mouth daily.     escitalopram 10 MG tablet  Commonly known as:  LEXAPRO  Take 10 mg by mouth daily.     levETIRAcetam 250 MG tablet  Commonly  known as:  KEPPRA  Take 250 mg by mouth 2 (two) times daily.     LORazepam 0.5 MG tablet  Commonly known as:  ATIVAN  Take 0.5 mg by mouth 2 (two) times daily as needed for anxiety.     simvastatin 40 MG tablet  Commonly known as:  ZOCOR  Take 40 mg by mouth daily.     temazepam 15 MG capsule  Commonly known as:  RESTORIL  Take 15 mg by mouth at bedtime as needed for sleep.        Meds ordered this encounter  Medications  . cephALEXin (KEFLEX) 250 MG capsule    Sig: Take 250 mg by mouth 2 (two) times daily. For 4 days  . levETIRAcetam (KEPPRA) 250 MG tablet    Sig: Take 250 mg by mouth 2 (two) times daily.  . celecoxib (CELEBREX) 100 MG capsule    Sig: Take 100 mg by mouth daily.  . clopidogrel (PLAVIX) 75 MG tablet    Sig: Take 75 mg by mouth daily.  . carvedilol (COREG CR) 10 MG 24 hr capsule    Sig: Take 10 mg by mouth daily.  Marland Kitchen  escitalopram (LEXAPRO) 10 MG tablet    Sig: Take 10 mg by mouth daily.  Marland Kitchen LORazepam (ATIVAN) 0.5 MG tablet    Sig: Take 0.5 mg by mouth 2 (two) times daily as needed for anxiety.  . simvastatin (ZOCOR) 40 MG tablet    Sig: Take 40 mg by mouth daily.  . temazepam (RESTORIL) 15 MG capsule    Sig: Take 15 mg by mouth at bedtime as needed for sleep.     There is no immunization history on file for this patient.  Social History  Substance Use Topics  . Smoking status: Never Smoker   . Smokeless tobacco: Not on file  . Alcohol Use: Not on file    Family history is + HD   Review of Systems  DATA OBTAINED: from nurse GENERAL:  no fevers,+ fatigue, appetite OK SKIN: No itching, rash  EYES: No eye pain, redness, discharge EARS: No earache, tinnitus, change in hearing NOSE: No congestion, drainage or bleeding  MOUTH/THROAT: No mouth or tooth pain, No sore throat RESPIRATORY: No cough, wheezing, SOB CARDIAC: No chest pain, palpitations, lower extremity edema  GI: No abdominal pain, No N/V/D or constipation, No heartburn or reflux  GU: No  dysuria, frequency or urgency, or incontinence  MUSCULOSKELETAL: No unrelieved bone/joint pain NEUROLOGIC: No headache, dizziness or focal weakness PSYCHIATRIC: No c/o anxiety or sadness   Filed Vitals:   11/19/14 2104  BP: 130/65  Pulse: 70  Temp: 98.2 F (36.8 C)  Resp: 18    SpO2 Readings from Last 1 Encounters:  No data found for SpO2        Physical Exam  GENERAL APPEARANCE: Alert, min conversant,  No acute distress.  SKIN: No diaphoresis rash, very pale HEAD: Normocephalic, atraumatic  EYES: Conjunctiva/lids clear. Pupils round, reactive. EOMs intact.  EARS: External exam WNL, canals clear. Hearing grossly normal.  NOSE: No deformity or discharge.  MOUTH/THROAT: Lips w/o lesions  RESPIRATORY: Breathing is even, unlabored. Lung sounds are clear   CARDIOVASCULAR: Heart RRR no murmurs, rubs or gallops. No peripheral edema.   GASTROINTESTINAL: Abdomen is soft, non-tender, not distended w/ normal bowel sounds. GENITOURINARY: Bladder non tender, not distended  MUSCULOSKELETAL: No abnormal joints or musculature NEUROLOGIC:  Cranial nerves 2-12 grossly intact. Moves all extremities, not in hx, probable dementia  PSYCHIATRIC: some dementa, no behavioral issues  Patient Active Problem List   Diagnosis Date Noted  . E. coli septic shock 11/20/2014  . E. coli UTI 11/20/2014  . Leukocytosis 11/20/2014  . Depression   . Seizures   . Hyperlipidemia   . Hypertensive heart disease with CHF   . CHF (congestive heart failure)   . Anxiety     CBC    Component Value Date/Time   WBC 7.8 05/14/2009 0343   RBC 4.30 05/14/2009 0343   HGB 12.5 05/14/2009 0343   HCT 35.7* 05/14/2009 0343   PLT 184 05/14/2009 0343   MCV 83.0 05/14/2009 0343   LYMPHSABS 2.7 05/14/2009 0343   MONOABS 1.3* 05/14/2009 0343   EOSABS 0.0 05/14/2009 0343   BASOSABS 0.0 05/14/2009 0343    CMP     Component Value Date/Time   NA 143 05/14/2009 0343   K 3.5 05/14/2009 0343   CL 108 05/14/2009  0343   CO2 27 05/14/2009 0343   GLUCOSE 94 05/14/2009 0343   BUN 20 05/14/2009 0343   CREATININE 0.66 05/14/2009 0343   CALCIUM 8.9 05/14/2009 0343   PROT 5.5* 07/05/2007 1900   ALBUMIN 2.9*  07/05/2007 1900   AST 12 07/05/2007 1900   ALT 11 07/05/2007 1900   ALKPHOS 51 07/05/2007 1900   BILITOT 0.8 07/05/2007 1900   GFRNONAA >60 05/14/2009 0343   GFRAA  05/14/2009 0343    >60        The eGFR has been calculated using the MDRD equation. This calculation has not been validated in all clinical situations. eGFR's persistently <60 mL/min signify possible Chronic Kidney Disease.    Lab Results  Component Value Date   HGBA1C  05/13/2009    6.0 (NOTE) The ADA recommends the following therapeutic goal for glycemic control related to Hgb A1c measurement: Goal of therapy: <6.5 Hgb A1c  Reference: American Diabetes Association: Clinical Practice Recommendations 2010, Diabetes Care, 2010, 33: (Suppl  1).     Mr Angiogram Head Wo Contrast  05/13/2009   Clinical Data:  Altered mental status.   MRI HEAD WITHOUT CONTRAST MRA HEAD WITHOUT CONTRAST   Technique: Multiplanar, multiecho pulse sequences of the brain and surrounding structures were obtained according to standard protocol without intravenous contrast.  Angiographic images of the head were obtained using MRA technique without contrast.   Comparison: 05/12/2009 CT.  No comparison MR.   MRI HEAD   Findings:  No acute infarct.  No intracranial hemorrhage.  Global atrophy.  Ventricular prominence may be related to atrophy although difficult to completely exclude mild hydrocephalus.  Moderate small vessel disease type changes.  No intracranial mass lesion detected on this unenhanced exam.  Major intracranial vascular structures are patent.   IMPRESSION: No acute infarct.   Atrophy as described above.   Moderate small vessel disease type changes.   MRA HEAD   Findings: Motion degraded exam.  Grading stenosis or evaluating for aneurysm is limited  given the motion.  Flow is seen within the anterior circulation medium and large size vessels.   Poor delineation of the right vertebral artery suggesting significant atherosclerotic type changes/occlusion.  Mild narrowing left vertebral artery and proximal basilar artery suspected. Nonvisualization PICAs and AICAs.  Branch vessel irregularity.   IMPRESSION: Motion degraded examination.  Most significant finding is the abnormal appearance of the right vertebral artery which may be occluded.  Please see above.  Provider: Margaretmary Eddy  Mr Brain Wo Contrast  05/13/2009   Clinical Data:  Altered mental status.   MRI HEAD WITHOUT CONTRAST MRA HEAD WITHOUT CONTRAST   Technique: Multiplanar, multiecho pulse sequences of the brain and surrounding structures were obtained according to standard protocol without intravenous contrast.  Angiographic images of the head were obtained using MRA technique without contrast.   Comparison: 05/12/2009 CT.  No comparison MR.   MRI HEAD   Findings:  No acute infarct.  No intracranial hemorrhage.  Global atrophy.  Ventricular prominence may be related to atrophy although difficult to completely exclude mild hydrocephalus.  Moderate small vessel disease type changes.  No intracranial mass lesion detected on this unenhanced exam.  Major intracranial vascular structures are patent.   IMPRESSION: No acute infarct.   Atrophy as described above.   Moderate small vessel disease type changes.   MRA HEAD   Findings: Motion degraded exam.  Grading stenosis or evaluating for aneurysm is limited given the motion.  Flow is seen within the anterior circulation medium and large size vessels.   Poor delineation of the right vertebral artery suggesting significant atherosclerotic type changes/occlusion.  Mild narrowing left vertebral artery and proximal basilar artery suspected. Nonvisualization PICAs and AICAs.  Branch vessel irregularity.   IMPRESSION:  Motion degraded examination.  Most significant  finding is the abnormal appearance of the right vertebral artery which may be occluded.  Please see above.  Provider: Margaretmary Eddy   Not all labs, radiology exams or other studies done during hospitalization come through on my EPIC note; however they are reviewed by me.    Assessment and Plan  E. coli septic shock Tx in ICU with levophed and fluid;IV abx with rocephin; urine cx+ E coli; SNF plan - pt d/c with 4 more days keflex; OT/PT  E. coli UTI Tx with rocephin in hospital SNF plan - keflex for 4 days  Seizures Developed while in ICU; maybe benzo withdrawal but Neuro consult rec tx SNF plan - continue Keppra 500 mg BID  Hypertensive heart disease with CHF vasotec was d/c ; SNF plan - continue coreg CR 10 mg daily; will obtain baseline BMP  CHF (congestive heart failure) SNF plan - cont coreg CR 10 mg daily  Leukocytosis WBC in hodp 15.7 ; SNF plan - no d/c info ;CBC to ensure nl/baseline WBC and H/H  Depression SNF plan - cont lexapro 10 mg  Hyperlipidemia SNF plan - continue zocor 40 mg    Hennie Duos, MD

## 2014-11-20 ENCOUNTER — Encounter: Payer: Self-pay | Admitting: Internal Medicine

## 2014-11-20 DIAGNOSIS — E785 Hyperlipidemia, unspecified: Secondary | ICD-10-CM | POA: Insufficient documentation

## 2014-11-20 DIAGNOSIS — I110 Hypertensive heart disease with heart failure: Secondary | ICD-10-CM | POA: Insufficient documentation

## 2014-11-20 DIAGNOSIS — F32A Depression, unspecified: Secondary | ICD-10-CM | POA: Insufficient documentation

## 2014-11-20 DIAGNOSIS — A4151 Sepsis due to Escherichia coli [E. coli]: Secondary | ICD-10-CM | POA: Insufficient documentation

## 2014-11-20 DIAGNOSIS — R569 Unspecified convulsions: Secondary | ICD-10-CM | POA: Insufficient documentation

## 2014-11-20 DIAGNOSIS — N390 Urinary tract infection, site not specified: Secondary | ICD-10-CM

## 2014-11-20 DIAGNOSIS — B9620 Unspecified Escherichia coli [E. coli] as the cause of diseases classified elsewhere: Secondary | ICD-10-CM | POA: Insufficient documentation

## 2014-11-20 DIAGNOSIS — R6521 Severe sepsis with septic shock: Principal | ICD-10-CM | POA: Insufficient documentation

## 2014-11-20 DIAGNOSIS — I509 Heart failure, unspecified: Secondary | ICD-10-CM | POA: Insufficient documentation

## 2014-11-20 DIAGNOSIS — F329 Major depressive disorder, single episode, unspecified: Secondary | ICD-10-CM | POA: Insufficient documentation

## 2014-11-20 DIAGNOSIS — D72829 Elevated white blood cell count, unspecified: Secondary | ICD-10-CM | POA: Insufficient documentation

## 2014-11-20 DIAGNOSIS — F419 Anxiety disorder, unspecified: Secondary | ICD-10-CM | POA: Insufficient documentation

## 2014-11-20 NOTE — Assessment & Plan Note (Signed)
Developed while in ICU; maybe benzo withdrawal but Neuro consult rec tx SNF plan - continue Keppra 500 mg BID

## 2014-11-20 NOTE — Assessment & Plan Note (Addendum)
vasotec was d/c ; SNF plan - continue coreg CR 10 mg daily; will obtain baseline BMP

## 2014-11-20 NOTE — Assessment & Plan Note (Signed)
Tx in ICU with levophed and fluid;IV abx with rocephin; urine cx+ E coli; SNF plan - pt d/c with 4 more days keflex; OT/PT

## 2014-11-20 NOTE — Assessment & Plan Note (Signed)
SNF plan - cont lexapro 10 mg

## 2014-11-20 NOTE — Assessment & Plan Note (Signed)
SNF plan - cont coreg CR 10 mg daily

## 2014-11-20 NOTE — Assessment & Plan Note (Signed)
Tx with rocephin in hospital SNF plan - keflex for 4 days

## 2014-11-20 NOTE — Assessment & Plan Note (Signed)
WBC in hodp 15.7 ; SNF plan - no d/c info ;CBC to ensure nl/baseline WBC and H/H

## 2014-11-20 NOTE — Assessment & Plan Note (Signed)
SNF plan - continue zocor 40 mg

## 2014-12-05 ENCOUNTER — Encounter: Payer: Self-pay | Admitting: Internal Medicine

## 2014-12-07 LAB — POCT INR: INR: 1.7 — AB (ref <=1.1)

## 2014-12-08 ENCOUNTER — Encounter: Payer: Self-pay | Admitting: Internal Medicine

## 2014-12-08 NOTE — Progress Notes (Signed)
Patient ID: Courtney Shaw, female   DOB: 16-Jul-1919, 81 y.o.   MRN: [id number] MRN: [id number] Name: Courtney Shaw  Sex: female Age: 81 y.o. DOB: 1919/05/10 Date of visit is 12/05/2014 [facility] #: [facility] Facility/Room:107 Level Of Care: SNF Provider: Wille Celeste Emergency Contacts: Extended Emergency Contact Information Primary Emergency Contact: [facility] Address: [address]  [place] Home Phone: [phone] Relation: None  Code Status:   Allergies: Review of patient's allergies indicates no known allergies.  Chief Complaint  Patient presents with  . Acute Visit   Acute visit secondary to vomiting  HPI: Patient is 81 y.o. female with hx HTN, CHF, CAD, HLD, depression, anxiety who was admitted to [facility] from 8/13-21 for E. Coli sepsis 2/2 UTI. Initially required ICU and pressors. Complicated by a seizure, not sure if from benzo withdrawal but neuro felt required long term tx. with Keppra. Pt was admitted to SNF for generalized weakness. While at SNF  followed for depression, tx with lexapro, HLD, tx with zocor and CHF tx with coreg. Apparently she had a vomiting episode last night-this reoccurred this morning it was undigested food according to her nurse-today she is not eating much if anything at all Her vital signs are stable     Past Medical History  Diagnosis Date  . Depression   . Seizures   . Hyperlipidemia   . Hypertension   . CHF (congestive heart failure)   . Anxiety     Past Surgical History  Procedure Laterality Date  . Coronary artery bypass graft    . Appendectomy    . Eye surgery      cataract      Medication List       This list is accurate as of: 12/05/14 11:59 PM.  Always use your most recent med list.               carvedilol 10 MG 24 hr capsule  Commonly known as:  COREG CR  Take 10 mg by mouth daily.     celecoxib 100 MG capsule  Commonly known as:  CELEBREX  Take 100 mg by mouth daily.      cephALEXin 250 MG capsule  Commonly known as:  KEFLEX  Take 250 mg by mouth 2 (two) times daily. For 4 days     clopidogrel 75 MG tablet  Commonly known as:  PLAVIX  Take 75 mg by mouth daily.     escitalopram 10 MG tablet  Commonly known as:  LEXAPRO  Take 10 mg by mouth daily.     levETIRAcetam 250 MG tablet  Commonly known as:  KEPPRA  Take 250 mg by mouth 2 (two) times daily.     LORazepam 0.5 MG tablet  Commonly known as:  ATIVAN  Take 0.5 mg by mouth 2 (two) times daily as needed for anxiety.     simvastatin 40 MG tablet  Commonly known as:  ZOCOR  Take 40 mg by mouth daily.     temazepam 15 MG capsule  Commonly known as:  RESTORIL  Take 15 mg by mouth at bedtime as needed for sleep.       of note she is no longer taking the Keflex      Social History  Substance Use Topics  . Smoking status: Never Smoker   . Smokeless tobacco: Not on file  . Alcohol Use: Not on file    Family history is + HD  Review of Systems  DATA OBTAINED: from nurse GENERAL:  no fevers,+ fatigue, appetite poor SKIN: No itching, rash  EYES: No eye pain, redness, discharge EARS: No earache, tinnitus, change in hearing NOSE: No congestion, drainage or bleeding  MOUTH/THROAT: No mouth or tooth pain, No sore throat RESPIRATORY: No cough, wheezing, SOB noted some congestion noted on exam CARDIAC: No chest pain, palpitations, lower extremity edema some mild intermittent tachycardia GI: No abdominal pain, No N/V/D or constipation, No heartburn or reflux  GU: No dysuria, frequency or urgency, or incontinence  MUSCULOSKELETAL: No unrelieved bone/joint pain NEUROLOGIC: No headache, dizziness or focal weakness PSYCHIATRIC: No c/o anxiety or sadness   Filed Vitals:   12/08/14 2115  BP: 130/70  Pulse: 16  Temp: 96.7 F (35.9 C)  Resp: 95   of note her pulse on update is 108 respirations are 16 these were inverted  Above O2 saturation per nursing is in the 90s on room  air       Physical Exam  GENERAL APPEARANCE: Frail elderly female, min conversant,  No acute distress.  SKIN: No diaphoresis rash, very pale HEAD: Normocephalic, atraumatic  EYES: Conjunctiva/lids clear. Pupils round, reactive. EOMs intact.  EARS: External exam WNL, canals clear. Hearing grossly normal.  NOSE: No deformity or discharge.  MOUTH/THROAT: Oropharynx is clear mucous membranes do not appear overtly dry at this time  RESPIRATORY: She has poor effort with questionable diffuse bronchial sounds there is no labored breathing  CARDIOVASCULAR: Heart regular rate and rhythm with occasional skipped beats mildly tachycardic ranging from high 90s to 108 no murmurs, rubs or gallops. No peripheral edema.   GASTROINTESTINAL: Abdomen is soft, non-tender, not distended w/ normal bowel sounds. GENITOURINARY: Bladder non tender, not distended  MUSCULOSKELETAL: No abnormal joints or musculature general frailty NEUROLOGIC:  Cranial nerves 2-12 grossly intact. Moves all extremities, but very weak probable dementia  PSYCHIATRIC: some dementa, no behavioral issues she is not really speaking much at all today  Patient Active Problem List   Diagnosis Date Noted  . E. coli septic shock 11/20/2014  . E. coli UTI 11/20/2014  . Leukocytosis 11/20/2014  . Depression   . Seizures   . Hyperlipidemia   . Hypertensive heart disease with CHF   . CHF (congestive heart failure)   . Anxiety     11/17/2014.  WBC 12.2 hemoglobin 10.7 platelets 290.  Sodium 140 potassium 3.9 BUN 4 creatinine 0.31.  11/13/2014-alkaline phosphatase 125 albumin 2.1 ALT 41-otherwise liver function tests within normal limits  CBC    Component Value Date/Time   WBC 7.8 05/14/2009 0343   RBC 4.30 05/14/2009 0343   HGB 12.5 05/14/2009 0343   HCT 35.7* 05/14/2009 0343   PLT 184 05/14/2009 0343   MCV 83.0 05/14/2009 0343   LYMPHSABS 2.7 05/14/2009 0343   MONOABS 1.3* 05/14/2009 0343   EOSABS 0.0 05/14/2009 0343    BASOSABS 0.0 05/14/2009 0343    CMP     Component Value Date/Time   NA 143 05/14/2009 0343   K 3.5 05/14/2009 0343   CL 108 05/14/2009 0343   CO2 27 05/14/2009 0343   GLUCOSE 94 05/14/2009 0343   BUN 20 05/14/2009 0343   CREATININE 0.66 05/14/2009 0343   CALCIUM 8.9 05/14/2009 0343   PROT 5.5* 07/05/2007 1900   ALBUMIN 2.9* 07/05/2007 1900   AST 12 07/05/2007 1900   ALT 11 07/05/2007 1900   ALKPHOS 51 07/05/2007 1900   BILITOT 0.8 07/05/2007 1900   GFRNONAA >60 05/14/2009 0343  GFRAA  05/14/2009 0343    >60        The eGFR has been calculated using the MDRD equation. This calculation has not been validated in all clinical situations. eGFR's persistently <60 mL/min signify possible Chronic Kidney Disease.    Lab Results  Component Value Date   HGBA1C  05/13/2009    6.0 (NOTE) The ADA recommends the following therapeutic goal for glycemic control related to Hgb A1c measurement: Goal of therapy: <6.5 Hgb A1c  Reference: American Diabetes Association: Clinical Practice Recommendations 2010, Diabetes Care, 2010, 33: (Suppl  1).     Mr Angiogram Head Wo Contrast  05/13/2009   Clinical Data:  Altered mental status.   MRI HEAD WITHOUT CONTRAST MRA HEAD WITHOUT CONTRAST   Technique: Multiplanar, multiecho pulse sequences of the brain and surrounding structures were obtained according to standard protocol without intravenous contrast.  Angiographic images of the head were obtained using MRA technique without contrast.   Comparison: 05/12/2009 CT.  No comparison MR.   MRI HEAD   Findings:  No acute infarct.  No intracranial hemorrhage.  Global atrophy.  Ventricular prominence may be related to atrophy although difficult to completely exclude mild hydrocephalus.  Moderate small vessel disease type changes.  No intracranial mass lesion detected on this unenhanced exam.  Major intracranial vascular structures are patent.   IMPRESSION: No acute infarct.   Atrophy as described above.    Moderate small vessel disease type changes.   MRA HEAD   Findings: Motion degraded exam.  Grading stenosis or evaluating for aneurysm is limited given the motion.  Flow is seen within the anterior circulation medium and large size vessels.   Poor delineation of the right vertebral artery suggesting significant atherosclerotic type changes/occlusion.  Mild narrowing left vertebral artery and proximal basilar artery suspected. Nonvisualization PICAs and AICAs.  Branch vessel irregularity.   IMPRESSION: Motion degraded examination.  Most significant finding is the abnormal appearance of the right vertebral artery which may be occluded.  Please see above.  Provider: Margaretmary Eddy  Mr Brain Wo Contrast  05/13/2009   Clinical Data:  Altered mental status.   MRI HEAD WITHOUT CONTRAST MRA HEAD WITHOUT CONTRAST   Technique: Multiplanar, multiecho pulse sequences of the brain and surrounding structures were obtained according to standard protocol without intravenous contrast.  Angiographic images of the head were obtained using MRA technique without contrast.   Comparison: 05/12/2009 CT.  No comparison MR.   MRI HEAD   Findings:  No acute infarct.  No intracranial hemorrhage.  Global atrophy.  Ventricular prominence may be related to atrophy although difficult to completely exclude mild hydrocephalus.  Moderate small vessel disease type changes.  No intracranial mass lesion detected on this unenhanced exam.  Major intracranial vascular structures are patent.   IMPRESSION: No acute infarct.   Atrophy as described above.   Moderate small vessel disease type changes.   MRA HEAD   Findings: Motion degraded exam.  Grading stenosis or evaluating for aneurysm is limited given the motion.  Flow is seen within the anterior circulation medium and large size vessels.   Poor delineation of the right vertebral artery suggesting significant atherosclerotic type changes/occlusion.  Mild narrowing left vertebral artery and proximal basilar  artery suspected. Nonvisualization PICAs and AICAs.  Branch vessel irregularity.   IMPRESSION: Motion degraded examination.  Most significant finding is the abnormal appearance of the right vertebral artery which may be occluded.  Please see above.  Provider: Margaretmary Eddy  Assessment and Plan  #1 vomiting 2 poor appetite some lethargy-with patient's history will check a UA C&S-she also has chest congestion will check a chest x-ray rule out aspiration.  Also obtain a CBC with differential and comprehensive metabolic panel staff notify provider of results.  Also will obtain an abdominal x-ray.  Also have any vomitus checked for occult blood also check her stool.  Addendum-we did receive chest x-ray results it does show positive infiltrate plan was to start Augmentin 500 mg twice a day for 10 days and monitor closely her vital signs pulse ox every shift-duo nebs every 6 hours when necessary.  However nursing staff states patient has vomited again she is not eating anything appears now somewhat unable to tolerate significant by mouth intake-we will send her to the ER for evaluation I feel if she stays here the risk of decline overnight would be fairly significant and would like to get this looked at expediently.  [id number]  LASSEN, ARLO C,     This encounter was created in error - please disregard.

## 2014-12-20 LAB — HEPATIC FUNCTION PANEL
ALK PHOS: 187 U/L — AB (ref 25–125)
ALT: 37 U/L — AB (ref 7–35)
AST: 38 U/L — AB (ref 13–35)
BILIRUBIN, TOTAL: 0.6 mg/dL

## 2014-12-20 LAB — BASIC METABOLIC PANEL
BUN: 12 mg/dL (ref 4–21)
Creatinine: 0.3 mg/dL — AB (ref <=1.1)
Potassium: 3.1 mmol/L — AB (ref 3.4–5.3)
SODIUM: 140 mmol/L (ref 137–147)

## 2014-12-20 LAB — CBC AND DIFFERENTIAL
HCT: 30 % — AB (ref 36–46)
HEMOGLOBIN: 9.2 g/dL — AB (ref 12.0–16.0)
Platelets: 231 10*3/uL (ref 150–399)
WBC: 12.9 10^3/mL

## 2014-12-25 ENCOUNTER — Non-Acute Institutional Stay (SKILLED_NURSING_FACILITY): Payer: Medicare Other | Admitting: Internal Medicine

## 2014-12-25 ENCOUNTER — Encounter: Payer: Self-pay | Admitting: Internal Medicine

## 2014-12-25 DIAGNOSIS — K208 Other esophagitis: Secondary | ICD-10-CM

## 2014-12-25 DIAGNOSIS — R569 Unspecified convulsions: Secondary | ICD-10-CM | POA: Diagnosis not present

## 2014-12-25 DIAGNOSIS — F32A Depression, unspecified: Secondary | ICD-10-CM

## 2014-12-25 DIAGNOSIS — J948 Other specified pleural conditions: Secondary | ICD-10-CM | POA: Diagnosis not present

## 2014-12-25 DIAGNOSIS — B3749 Other urogenital candidiasis: Secondary | ICD-10-CM | POA: Diagnosis not present

## 2014-12-25 DIAGNOSIS — F329 Major depressive disorder, single episode, unspecified: Secondary | ICD-10-CM | POA: Diagnosis not present

## 2014-12-25 DIAGNOSIS — K2210 Ulcer of esophagus without bleeding: Secondary | ICD-10-CM

## 2014-12-25 DIAGNOSIS — J90 Pleural effusion, not elsewhere classified: Secondary | ICD-10-CM

## 2014-12-25 DIAGNOSIS — C169 Malignant neoplasm of stomach, unspecified: Secondary | ICD-10-CM

## 2014-12-25 NOTE — Progress Notes (Signed)
MRN: [id number] Name: Courtney Shaw  Sex: female Age: 81 y.o. DOB: 05/24/1919  [place] #: [facility] Facility/Room:207 Level Of Care: SNF Provider: Inocencio Homes D Emergency Contacts: Extended Emergency Contact Information Primary Emergency Contact: Kudla,Merle Address: [address]          [place] [address] [place] of [place] Phone: [phone] Relation: Daughter Secondary Emergency Contact: Hendricjs,Amanda Address: [address]          [place],  [address] [place]te Litter of [place] Phone: [phone] Mobile Phone: [phone] Relation: Grandaughter  Code Status:   Allergies: Review of patient's allergies indicates no known allergies.  Chief Complaint  Patient presents with  . New Admit To SNF    HPI: Patient is 81 y.o. female  With hx HTN, CHF, CAD, HLD, depression, anxiety who was admitted to [facility] from 8/13-21 for E. Coli sepsis 2/2 UTI. Initially required ICU and pressors,complicated by development of seizure. Pt was readmitted to hospital from 9/8-23 initially for sepsis and aspiration PNA, discovered to have gastric outlet obstruction at pylorus. Seen by GI, who scoped her and found ulcerative esophagitis and performed b at obstruction site that returned as metastatic adenoCA of gastric origin. Pt underwent exploratory lap with decompressive gastrojejunal bypass. Hospitalization complicated again by candiduria and aspiration PNA with R pleural effusion. At this pt pt's daughter elected no further intervention and desire for Hospice. Because [facility] would not accept pt she is admitted to SNF with dx metastatic gastric CA with profound weakness for residency, and end of life care. While at SNF pt will be tx for espohagitis with protonix, depression with lexapro and seizures with keppra.  Past Medical History  Diagnosis Date  . Depression   . Seizures ([place])   . Hyperlipidemia   . Hypertension   . CHF (congestive heart failure) ([place])   .  Anxiety     Past Surgical History  Procedure Laterality Date  . Coronary artery bypass graft    . Appendectomy    . Eye surgery      cataract      Medication List       This list is accurate as of: 12/25/14 11:59 PM.  Always use your most recent med list.               acetaminophen-codeine 300-30 MG tablet  Commonly known as:  TYLENOL #3  Take by mouth every 6 (six) hours as needed for moderate pain.     albuterol (2.5 MG/3ML) 0.083% nebulizer solution  Commonly known as:  PROVENTIL  Take 2.5 mg by nebulization every 6 (six) hours as needed for wheezing or shortness of breath.     bisacodyl 10 MG suppository  Commonly known as:  DULCOLAX  Place 10 mg rectally daily as needed for moderate constipation.     carvedilol 10 MG 24 hr capsule  Commonly known as:  COREG CR  Take 10 mg by mouth daily.     celecoxib 100 MG capsule  Commonly known as:  CELEBREX  Take 100 mg by mouth daily.     escitalopram 10 MG tablet  Commonly known as:  LEXAPRO  Take 10 mg by mouth daily.     fluconazole 200 MG tablet  Commonly known as:  DIFLUCAN  Take 200 mg by mouth daily. 4 more days     furosemide 20 MG tablet  Commonly known as:  LASIX  Take 20 mg by mouth every other day.     ipratropium 0.02 %  nebulizer solution  Commonly known as:  ATROVENT  Take 0.5 mg by nebulization every 6 (six) hours as needed for wheezing or shortness of breath.     levETIRAcetam 250 MG tablet  Commonly known as:  KEPPRA  Take 250 mg by mouth 2 (two) times daily.     levofloxacin 500 MG tablet  Commonly known as:  LEVAQUIN  Take 500 mg by mouth daily. 4 more days     LORazepam 0.5 MG tablet  Commonly known as:  ATIVAN  Take 0.5 mg by mouth 2 (two) times daily as needed for anxiety.     pantoprazole 40 MG tablet  Commonly known as:  PROTONIX  Take 40 mg by mouth 2 (two) times daily.     potassium chloride SA 20 MEQ tablet  Commonly known as:  K-DUR,KLOR-CON  Take 20 mEq by mouth daily.  5 more doses     saccharomyces boulardii 250 MG capsule  Commonly known as:  FLORASTOR  Take 250 mg by mouth 2 (two) times daily.     temazepam 15 MG capsule  Commonly known as:  RESTORIL  Take 15 mg by mouth at bedtime as needed for sleep.        Meds ordered this encounter  Medications  . acetaminophen-codeine (TYLENOL #3) 300-30 MG tablet    Sig: Take by mouth every 6 (six) hours as needed for moderate pain.  . fluconazole (DIFLUCAN) 200 MG tablet    Sig: Take 200 mg by mouth daily. 4 more days  . furosemide (LASIX) 20 MG tablet    Sig: Take 20 mg by mouth every other day.  . pantoprazole (PROTONIX) 40 MG tablet    Sig: Take 40 mg by mouth 2 (two) times daily.  Marland Kitchen levofloxacin (LEVAQUIN) 500 MG tablet    Sig: Take 500 mg by mouth daily. 4 more days  . potassium chloride SA (K-DUR,KLOR-CON) 20 MEQ tablet    Sig: Take 20 mEq by mouth daily. 5 more doses  . albuterol (PROVENTIL) (2.5 MG/3ML) 0.083% nebulizer solution    Sig: Take 2.5 mg by nebulization every 6 (six) hours as needed for wheezing or shortness of breath.  . bisacodyl (DULCOLAX) 10 MG suppository    Sig: Place 10 mg rectally daily as needed for moderate constipation.  Marland Kitchen ipratropium (ATROVENT) 0.02 % nebulizer solution    Sig: Take 0.5 mg by nebulization every 6 (six) hours as needed for wheezing or shortness of breath.  . saccharomyces boulardii (FLORASTOR) 250 MG capsule    Sig: Take 250 mg by mouth 2 (two) times daily.     There is no immunization history on file for this patient.  Social History  Substance Use Topics  . Smoking status: Never Smoker   . Smokeless tobacco: Not on file  . Alcohol Use: Not on file    Family history is + HD   Review of Systems  DATA OBTAINED: from nurse, daughter GENERAL:  no fevers,+ fatigue,poor  Appetite, SP -would like real food but any other diet bad idea SKIN: No itching, rash  EYES: No eye pain, redness, discharge EARS: No earache, tinnitus, change in  hearing NOSE: No congestion, drainage or bleeding  MOUTH/THROAT: No mouth or tooth pain, No sore throat RESPIRATORY: No cough, wheezing, SOB CARDIAC: No chest pain, palpitations, lower extremity edema  GI: No abdominal pain, No N/V/D or constipation, No heartburn or reflux  GU: No dysuria, frequency or urgency, or incontinence  MUSCULOSKELETAL: No unrelieved bone/joint pain NEUROLOGIC: No  headache, dizziness  PSYCHIATRIC: No c/o anxiety or sadness   Filed Vitals:   12/25/14 1238  BP: 138/75  Pulse: 73  Temp: 97.9 F (36.6 C)  Resp: 18    SpO2 Readings from Last 1 Encounters:  No data found for SpO2        Physical Exam  GENERAL APPEARANCE: Alert,min conversant,  No acute distress but extremely weak appearing, sitting in WC in room  SKIN: No diaphoresis rash HEAD: Normocephalic, atraumatic  EYES: Conjunctiva/lids clear. Pupils round, reactive. EOMs intact.  EARS: External exam WNL, canals clear. Hearing grossly normal.  NOSE: No deformity or discharge.  MOUTH/THROAT: Lips w/o lesions  RESPIRATORY: Breathing is even, unlabored. Lung sounds are mod dec on R  CARDIOVASCULAR: Heart RRR no murmurs, rubs or gallops. No peripheral edema.   GASTROINTESTINAL: Abdomen is soft, non-tender, not distended w/ normal bowel sounds. GENITOURINARY: Bladder non tender, not distended  MUSCULOSKELETAL: No abnormal joints or musculature NEUROLOGIC:  Cranial nerves 2-12 grossly intact. Moves all extremities  PSYCHIATRIC: Mood and affect appropriate to situation, no behavioral issues  Patient Active Problem List   Diagnosis Date Noted  . Gastric adenocarcinoma ([place]) 12/29/2014  . Erosive esophagitis 12/29/2014  . Candida UTI 12/29/2014  . Pleural effusion, right 12/29/2014  . E. coli septic shock ([place]) 11/20/2014  . E. coli UTI 11/20/2014  . Leukocytosis 11/20/2014  . Depression   . Seizures ([place])   . Hyperlipidemia   . Hypertensive heart disease with CHF ([place])   . CHF (congestive  heart failure) ([place])   . Anxiety     CBC    Component Value Date/Time   WBC 7.8 05/14/2009 0343   RBC 4.30 05/14/2009 0343   HGB 12.5 05/14/2009 0343   HCT 35.7* 05/14/2009 0343   PLT 184 05/14/2009 0343   MCV 83.0 05/14/2009 0343   LYMPHSABS 2.7 05/14/2009 0343   MONOABS 1.3* 05/14/2009 0343   EOSABS 0.0 05/14/2009 0343   BASOSABS 0.0 05/14/2009 0343    CMP     Component Value Date/Time   NA 143 05/14/2009 0343   K 3.5 05/14/2009 0343   CL 108 05/14/2009 0343   CO2 27 05/14/2009 0343   GLUCOSE 94 05/14/2009 0343   BUN 20 05/14/2009 0343   CREATININE 0.66 05/14/2009 0343   CALCIUM 8.9 05/14/2009 0343   PROT 5.5* 07/05/2007 1900   ALBUMIN 2.9* 07/05/2007 1900   AST 12 07/05/2007 1900   ALT 11 07/05/2007 1900   ALKPHOS 51 07/05/2007 1900   BILITOT 0.8 07/05/2007 1900   GFRNONAA >60 05/14/2009 0343   GFRAA  05/14/2009 0343    >60        The eGFR has been calculated using the MDRD equation. This calculation has not been validated in all clinical situations. eGFR's persistently <60 mL/min signify possible Chronic Kidney Disease.    Lab Results  Component Value Date   HGBA1C  05/13/2009    6.0 (NOTE) The ADA recommends the following therapeutic goal for glycemic control related to Hgb A1c measurement: Goal of therapy: <6.5 Hgb A1c  Reference: American Diabetes Association: Clinical Practice Recommendations 2010, Diabetes Care, 2010, 33: (Suppl  1).     Mr Angiogram Head Wo Contrast  05/13/2009   Clinical Data:  Altered mental status.   MRI HEAD WITHOUT CONTRAST MRA HEAD WITHOUT CONTRAST   Technique: Multiplanar, multiecho pulse sequences of the brain and surrounding structures were obtained according to standard protocol without intravenous contrast.  Angiographic images of the head were obtained  using MRA technique without contrast.   Comparison: 05/12/2009 CT.  No comparison MR.   MRI HEAD   Findings:  No acute infarct.  No intracranial hemorrhage.  Global  atrophy.  Ventricular prominence may be related to atrophy although difficult to completely exclude mild hydrocephalus.  Moderate small vessel disease type changes.  No intracranial mass lesion detected on this unenhanced exam.  Major intracranial vascular structures are patent.   IMPRESSION: No acute infarct.   Atrophy as described above.   Moderate small vessel disease type changes.   MRA HEAD   Findings: Motion degraded exam.  Grading stenosis or evaluating for aneurysm is limited given the motion.  Flow is seen within the anterior circulation medium and large size vessels.   Poor delineation of the right vertebral artery suggesting significant atherosclerotic type changes/occlusion.  Mild narrowing left vertebral artery and proximal basilar artery suspected. Nonvisualization PICAs and AICAs.  Branch vessel irregularity.   IMPRESSION: Motion degraded examination.  Most significant finding is the abnormal appearance of the right vertebral artery which may be occluded.  Please see above.  Provider: Margaretmary Eddy  Mr Brain Wo Contrast  05/13/2009   Clinical Data:  Altered mental status.   MRI HEAD WITHOUT CONTRAST MRA HEAD WITHOUT CONTRAST   Technique: Multiplanar, multiecho pulse sequences of the brain and surrounding structures were obtained according to standard protocol without intravenous contrast.  Angiographic images of the head were obtained using MRA technique without contrast.   Comparison: 05/12/2009 CT.  No comparison MR.   MRI HEAD   Findings:  No acute infarct.  No intracranial hemorrhage.  Global atrophy.  Ventricular prominence may be related to atrophy although difficult to completely exclude mild hydrocephalus.  Moderate small vessel disease type changes.  No intracranial mass lesion detected on this unenhanced exam.  Major intracranial vascular structures are patent.   IMPRESSION: No acute infarct.   Atrophy as described above.   Moderate small vessel disease type changes.   MRA HEAD   Findings:  Motion degraded exam.  Grading stenosis or evaluating for aneurysm is limited given the motion.  Flow is seen within the anterior circulation medium and large size vessels.   Poor delineation of the right vertebral artery suggesting significant atherosclerotic type changes/occlusion.  Mild narrowing left vertebral artery and proximal basilar artery suspected. Nonvisualization PICAs and AICAs.  Branch vessel irregularity.   IMPRESSION: Motion degraded examination.  Most significant finding is the abnormal appearance of the right vertebral artery which may be occluded.  Please see above.  Provider: Margaretmary Eddy   Not all labs, radiology exams or other studies done during hospitalization come through on my EPIC note; however they are reviewed by me.    Assessment and Plan  Gastric adenocarcinoma ([place]) Exploratory lap with gastro-jejunal bypass; bx c/w adeno CA of gastric origin; pureed, honey thick SNF - spoke with daughter, desires hospice, comfort care, no IV, no hosp, no PEG, no intubate, just comfort  Erosive esophagitis Dx by endoscopy; SNF - cont protonix 40 mg BID  Candida UTI SNF - diflucan 290m daily for 7 days  [facility], mod R pleural effusion , possible CHF; pt was started on lasix 20 mg and levaquin for 7 days; SNF - pt continued on Lasix but po intake poor, pt to be Hospice, spoke with daughter, lasix d/c;pt at this time with no resp sx  Seizures SNF - pt continued on Keppra but after spoke with daughter it  appears both seizures from benzo withdrawal;pt at low risk now, pt to be Hospice , will d/c keppra  Depression SNF - cont lexapro   Time spent > 45 min; > 50% of time with patient was spent reviewing records, labs, tests and studies, counseling and developing plan of care  Hennie Duos, MD

## 2014-12-29 ENCOUNTER — Encounter: Payer: Self-pay | Admitting: Internal Medicine

## 2014-12-29 DIAGNOSIS — J90 Pleural effusion, not elsewhere classified: Secondary | ICD-10-CM | POA: Insufficient documentation

## 2014-12-29 DIAGNOSIS — C169 Malignant neoplasm of stomach, unspecified: Secondary | ICD-10-CM | POA: Insufficient documentation

## 2014-12-29 DIAGNOSIS — K2210 Ulcer of esophagus without bleeding: Secondary | ICD-10-CM | POA: Insufficient documentation

## 2014-12-29 DIAGNOSIS — B3749 Other urogenital candidiasis: Secondary | ICD-10-CM | POA: Insufficient documentation

## 2014-12-29 NOTE — Assessment & Plan Note (Signed)
SNF - diflucan 200mg  daily for 7 days

## 2014-12-29 NOTE — Assessment & Plan Note (Signed)
SNF - cont lexapro

## 2014-12-29 NOTE — Assessment & Plan Note (Signed)
SNF - pt continued on Keppra but after spoke with daughter it appears both seizures from benzo withdrawal;pt at low risk now, pt to be Hospice , will d/c keppra

## 2014-12-29 NOTE — Assessment & Plan Note (Signed)
Exploratory lap with gastro-jejunal bypass; bx c/w adeno CA of gastric origin; pureed, honey thick SNF - spoke with daughter, desires hospice, comfort care, no IV, no hosp, no PEG, no intubate, just comfort

## 2014-12-29 NOTE — Assessment & Plan Note (Signed)
Dx by endoscopy; SNF - cont protonix 40 mg BID

## 2014-12-29 NOTE — Assessment & Plan Note (Signed)
CXR at [facility] showed residual aspiration PNA, mod R pleural effusion , possible CHF; pt was started on lasix 20 mg and levaquin for 7 days; SNF - pt continued on Lasix but po intake poor, pt to be Hospice, spoke with daughter, lasix d/c;pt at this time with no resp sx

## 2014-12-30 ENCOUNTER — Encounter: Payer: Self-pay | Admitting: *Deleted

## 2014-12-30 ENCOUNTER — Non-Acute Institutional Stay (SKILLED_NURSING_FACILITY): Payer: Medicare Other | Admitting: Internal Medicine

## 2014-12-30 ENCOUNTER — Encounter: Payer: Self-pay | Admitting: Internal Medicine

## 2014-12-30 DIAGNOSIS — Z9889 Other specified postprocedural states: Secondary | ICD-10-CM | POA: Diagnosis not present

## 2014-12-30 DIAGNOSIS — C169 Malignant neoplasm of stomach, unspecified: Secondary | ICD-10-CM | POA: Diagnosis not present

## 2014-12-30 NOTE — Progress Notes (Signed)
Patient ID: Courtney Shaw, female   DOB: 03/11/1920, 81 y.o.   MRN: [id number] MRN: [id number] Name: Courtney Shaw  Sex: female Age: 81 y.o. DOB: [birth year]  [facility] #: [facility] Facility/Room:207 Level Of Care: SNF Provider: Wille Celeste Emergency Contacts: Extended Emergency Contact Information Primary Emergency Contact: Kudla,Merle Address: [place]           [address] [address] Phone: [phone] Relation: Daughter Secondary Emergency Contact: Hendricjs,Amanda Address: [address]          [place], [place] [address] [place] of [place] Phone: [phone] Mobile Phone: [phone] Relation: Grandaughter  Code Status:   Allergies: Review of patient's allergies indicates no known allergies.   Chief complaint-follow up  drainage abdominal surgical site   HPI: Patient is 81 y.o. female  With hx HTN, CHF, CAD, HLD, depression, anxiety who was admitted to [facility] from 8/13-21 for E. Coli sepsis 2/2 UTI. Initially required ICU and pressors,complicated by development of seizure. Pt was readmitted to hospital from 9/8-23 initially for sepsis and aspiration PNA, discovered to have gastric outlet obstruction at pylorus. Seen by GI, who scoped her and found ulcerative esophagitis and performed b at obstruction site that returned as metastatic adenoCA of gastric origin. Pt underwent exploratory lap with decompressive gastrojejunal bypass. Hospitalization complicated again by candiduria and aspiration PNA with R pleural effusion. At this pt pt's daughter elected no further intervention and desire for Hospice. Because [facility] would not accept pt she is admitted to SNF with dx metastatic gastric CA with profound weakness for residency, and end of life care.  Nursing staff has notedsdrainage from the superior aspect of the abdominal surgical site and I am following up on this  Patient currently is afebrile vital signs appear to be  stable   Past Medical History  Diagnosis Date  . Depression   . Seizures ([place])   . Hyperlipidemia   . Hypertension   . CHF (congestive heart failure) ([place])   . Anxiety     Past Surgical History  Procedure Laterality Date  . Coronary artery bypass graft    . Appendectomy    . Eye surgery      cataract      Medication List       This list is accurate as of: 12/30/14 11:59 PM.  Always use your most recent med list.               acetaminophen-codeine 300-30 MG tablet  Commonly known as:  TYLENOL #3  Take by mouth every 6 (six) hours as needed for moderate pain.     albuterol (2.5 MG/3ML) 0.083% nebulizer solution  Commonly known as:  PROVENTIL  Take 2.5 mg by nebulization every 6 (six) hours as needed for wheezing or shortness of breath.     bisacodyl 10 MG suppository  Commonly known as:  DULCOLAX  Place 10 mg rectally daily as needed for moderate constipation.     carvedilol 10 MG 24 hr capsule  Commonly known as:  COREG CR  Take 10 mg by mouth daily.     celecoxib 100 MG capsule  Commonly known as:  CELEBREX  Take 100 mg by mouth daily.     escitalopram 10 MG tablet  Commonly known as:  LEXAPRO  Take 10 mg by mouth daily.     fluconazole 200 MG tablet  Commonly known as:  DIFLUCAN  Take 200 mg by mouth daily. 4 more days  furosemide 20 MG tablet  Commonly known as:  LASIX  Take 20 mg by mouth every other day.     ipratropium 0.02 % nebulizer solution  Commonly known as:  ATROVENT  Take 0.5 mg by nebulization every 6 (six) hours as needed for wheezing or shortness of breath.     levETIRAcetam 250 MG tablet  Commonly known as:  KEPPRA  Take 250 mg by mouth 2 (two) times daily.     levofloxacin 500 MG tablet  Commonly known as:  LEVAQUIN  Take 500 mg by mouth daily. 4 more days     LORazepam 0.5 MG tablet  Commonly known as:  ATIVAN  Take 0.5 mg by mouth 2 (two) times daily as needed for anxiety.     pantoprazole 40 MG tablet  Commonly  known as:  PROTONIX  Take 40 mg by mouth 2 (two) times daily.     potassium chloride SA 20 MEQ tablet  Commonly known as:  K-DUR,KLOR-CON  Take 20 mEq by mouth daily. 5 more doses     promethazine 25 MG suppository  Commonly known as:  PHENERGAN  Place 25 mg rectally every 8 (eight) hours as needed for nausea or vomiting.     saccharomyces boulardii 250 MG capsule  Commonly known as:  FLORASTOR  Take 250 mg by mouth 2 (two) times daily.     temazepam 15 MG capsule  Commonly known as:  RESTORIL  Take 15 mg by mouth at bedtime as needed for sleep.        No orders of the defined types were placed in this encounter.     There is no immunization history on file for this patient.  Social History  Substance Use Topics  . Smoking status: Never Smoker   . Smokeless tobacco: Not on file  . Alcohol Use: Not on file    Family history is + HD   Review of Systems  DATA OBTAINED: from nurse,  GENERAL:  no fevers,+ fatigue,poor  Appetite, SP -would like real food but any other diet bad idea SKIN: No itching, rash abdominal surgical site as noted above has some drainage EYES: No eye pain, redness, discharge EARS: No earache, tinnitus, change in hearing NOSE: No congestion, drainage or bleeding  MOUTH/THROAT: No mouth or tooth pain, No sore throat RESPIRATORY: No cough, wheezing, SOB CARDIAC: No chest pain, palpitations, lower extremity edema  GI: No abdominal pain, No N/V/D or constipation, No heartburn or reflux  GU: No dysuria, frequency or urgency, or incontinence  MUSCULOSKELETAL: No unrelieved bone/joint pain NEUROLOGIC: No headache, dizziness  PSYCHIATRIC: No c/o anxiety or sadness   Filed Vitals:   12/30/14 2303  BP: 94/58  Pulse: 71  Temp: 96.5 F (35.8 C)  Resp: 16    SpO2 Readings from Last 1 Encounters:  No data found for SpO2        Physical Exam  GENERAL APPEARANCE: Alert,min conversant,  No acute distress but extremely weak appearing, si SKIN: No  diaphoresis rashsuperior aspect of abdominal surgical site there is a small amount of yellow serous drainage there is no tenderness to palpation edema erythema or bleeding noted I could not really note any firmness when palpated around the surgical site HEAD: Normocephalic, atraumatic  EYES: Conjunctiva/lids clear. Pupils round, reactive. EOMs intact.  EARS: External exam WNL, canals clear. Hearing grossly normal.  NOSE: No deformity or discharge.  MOUTH/THROAT: Lips w/o lesions  RESPIRATORY: Breathing is even, unlabored. Lung sounds are shallow  CARDIOVASCULAR: Heart  RRR no murmurs, rubs or gallops. No peripheral edema.   GASTROINTESTINAL: Abdomen is soft, non-tender, not distended w/ normal bowel sounds. GENITOURINARY: Bladder non tender, not distended  MUSCULOSKELETAL: No abnormal joints or musculature NEUROLOGIC:  Cranial nerves 2-12 grossly intact. Moves all extremities  PSYCHIATRIC: is nonverbal  Patient Active Problem List   Diagnosis Date Noted  . Status post incision and drainage 12/30/2014  . Gastric adenocarcinoma ([place]) 12/29/2014  . Erosive esophagitis 12/29/2014  . Candida UTI 12/29/2014  . Pleural effusion, right 12/29/2014  . E. coli septic shock ([place]) 11/20/2014  . E. coli UTI 11/20/2014  . Leukocytosis 11/20/2014  . Depression   . Seizures ([place])   . Hyperlipidemia   . Hypertensive heart disease with CHF ([place])   . CHF (congestive heart failure) ([place])   . Anxiety     CBC    Component Value Date/Time   WBC 12.9 12/20/2014   WBC 7.8 05/14/2009 0343   RBC 4.30 05/14/2009 0343   HGB 9.2* 12/20/2014   HCT 30* 12/20/2014   PLT 231 12/20/2014   MCV 83.0 05/14/2009 0343   LYMPHSABS 2.7 05/14/2009 0343   MONOABS 1.3* 05/14/2009 0343   EOSABS 0.0 05/14/2009 0343   BASOSABS 0.0 05/14/2009 0343    CMP     Component Value Date/Time   NA 140 12/20/2014   NA 143 05/14/2009 0343   K 3.1* 12/20/2014   CL 108 05/14/2009 0343   CO2 27 05/14/2009 0343   GLUCOSE 94  05/14/2009 0343   BUN 12 12/20/2014   BUN 20 05/14/2009 0343   CREATININE 0.3* 12/20/2014   CREATININE 0.66 05/14/2009 0343   CALCIUM 8.9 05/14/2009 0343   PROT 5.5* 07/05/2007 1900   ALBUMIN 2.9* 07/05/2007 1900   AST 38* 12/20/2014   ALT 37* 12/20/2014   ALKPHOS 187* 12/20/2014   BILITOT 0.8 07/05/2007 1900   GFRNONAA >60 05/14/2009 0343   GFRAA  05/14/2009 0343    >60        The eGFR has been calculated using the MDRD equation. This calculation has not been validated in all clinical situations. eGFR's persistently <60 mL/min signify possible Chronic Kidney Disease.    Lab Results  Component Value Date   HGBA1C  05/13/2009    6.0 (NOTE) The ADA recommends the following therapeutic goal for glycemic control related to Hgb A1c measurement: Goal of therapy: <6.5 Hgb A1c  Reference: American Diabetes Association: Clinical Practice Recommendations 2010, Diabetes Care, 2010, 33: (Suppl  1).     Mr Angiogram Head Wo Contrast  05/13/2009   Clinical Data:  Altered mental status.   MRI HEAD WITHOUT CONTRAST MRA HEAD WITHOUT CONTRAST   Technique: Multiplanar, multiecho pulse sequences of the brain and surrounding structures were obtained according to standard protocol without intravenous contrast.  Angiographic images of the head were obtained using MRA technique without contrast.   Comparison: 05/12/2009 CT.  No comparison MR.   MRI HEAD   Findings:  No acute infarct.  No intracranial hemorrhage.  Global atrophy.  Ventricular prominence may be related to atrophy although difficult to completely exclude mild hydrocephalus.  Moderate small vessel disease type changes.  No intracranial mass lesion detected on this unenhanced exam.  Major intracranial vascular structures are patent.   IMPRESSION: No acute infarct.   Atrophy as described above.   Moderate small vessel disease type changes.   MRA HEAD   Findings: Motion degraded exam.  Grading stenosis or evaluating for aneurysm is limited given  the motion.  Flow is seen within the anterior circulation medium and large size vessels.   Poor delineation of the right vertebral artery suggesting significant atherosclerotic type changes/occlusion.  Mild narrowing left vertebral artery and proximal basilar artery suspected. Nonvisualization PICAs and AICAs.  Branch vessel irregularity.   IMPRESSION: Motion degraded examination.  Most significant finding is the abnormal appearance of the right vertebral artery which may be occluded.  Please see above.  Provider: Margaretmary Eddy  Mr Brain Wo Contrast  05/13/2009   Clinical Data:  Altered mental status.   MRI HEAD WITHOUT CONTRAST MRA HEAD WITHOUT CONTRAST   Technique: Multiplanar, multiecho pulse sequences of the brain and surrounding structures were obtained according to standard protocol without intravenous contrast.  Angiographic images of the head were obtained using MRA technique without contrast.   Comparison: 05/12/2009 CT.  No comparison MR.   MRI HEAD   Findings:  No acute infarct.  No intracranial hemorrhage.  Global atrophy.  Ventricular prominence may be related to atrophy although difficult to completely exclude mild hydrocephalus.  Moderate small vessel disease type changes.  No intracranial mass lesion detected on this unenhanced exam.  Major intracranial vascular structures are patent.   IMPRESSION: No acute infarct.   Atrophy as described above.   Moderate small vessel disease type changes.   MRA HEAD   Findings: Motion degraded exam.  Grading stenosis or evaluating for aneurysm is limited given the motion.  Flow is seen within the anterior circulation medium and large size vessels.   Poor delineation of the right vertebral artery suggesting significant atherosclerotic type changes/occlusion.  Mild narrowing left vertebral artery and proximal basilar artery suspected. Nonvisualization PICAs and AICAs.  Branch vessel irregularity.   IMPRESSION: Motion degraded examination.  Most significant finding  is the abnormal appearance of the right vertebral artery which may be occluded.  Please see above.  Provider: Margaretmary Eddy   Not all labs, radiology exams or other studies done during hospitalization come through on my EPIC note; however they are reviewed by me.    Assessment and Plan  Drainage from abdominal surgical site-this does not appear to be acutely infected currently-nursing staff has contacted surgeon for follow-up at this point will monitor for any changes any sign of acute infection clinically she appears stable again she is very frail and continues under essentially comfort measures she does not appear to be uncomfortable  [id number]

## 2015-01-28 DEATH — deceased
